# Patient Record
Sex: Female | Born: 1970 | Race: Black or African American | Hispanic: No | Marital: Single | Smoking: Never smoker
Health system: Southern US, Community
[De-identification: ages and names within clinical notes are randomized; demographics above are authoritative.]

## PROBLEM LIST (undated history)

## (undated) DIAGNOSIS — I10 Essential (primary) hypertension: Secondary | ICD-10-CM

## (undated) DIAGNOSIS — E119 Type 2 diabetes mellitus without complications: Secondary | ICD-10-CM

## (undated) HISTORY — PX: ABDOMINAL HYSTERECTOMY: SHX81

## (undated) HISTORY — DX: Type 2 diabetes mellitus without complications: E11.9

## (undated) HISTORY — PX: CHOLECYSTECTOMY: SHX55

## (undated) HISTORY — PX: OTHER SURGICAL HISTORY: SHX169

## (undated) HISTORY — DX: Essential (primary) hypertension: I10

---

## 2000-02-16 ENCOUNTER — Encounter: Admission: RE | Admit: 2000-02-16 | Discharge: 2000-02-16 | Payer: Self-pay | Admitting: Internal Medicine

## 2000-02-16 ENCOUNTER — Encounter: Payer: Self-pay | Admitting: Internal Medicine

## 2000-11-26 ENCOUNTER — Emergency Department (HOSPITAL_COMMUNITY): Admission: EM | Admit: 2000-11-26 | Discharge: 2000-11-27 | Payer: Self-pay | Admitting: Emergency Medicine

## 2000-11-28 ENCOUNTER — Emergency Department (HOSPITAL_COMMUNITY): Admission: EM | Admit: 2000-11-28 | Discharge: 2000-11-28 | Payer: Self-pay | Admitting: Emergency Medicine

## 2001-05-20 ENCOUNTER — Other Ambulatory Visit: Admission: RE | Admit: 2001-05-20 | Discharge: 2001-05-20 | Payer: Self-pay | Admitting: Obstetrics and Gynecology

## 2003-01-30 ENCOUNTER — Encounter: Payer: Self-pay | Admitting: Internal Medicine

## 2003-01-30 ENCOUNTER — Ambulatory Visit (HOSPITAL_COMMUNITY): Admission: RE | Admit: 2003-01-30 | Discharge: 2003-01-30 | Payer: Self-pay | Admitting: *Deleted

## 2004-04-26 ENCOUNTER — Encounter: Admission: RE | Admit: 2004-04-26 | Discharge: 2004-04-26 | Payer: Self-pay | Admitting: Internal Medicine

## 2004-06-28 ENCOUNTER — Ambulatory Visit (HOSPITAL_COMMUNITY): Admission: RE | Admit: 2004-06-28 | Discharge: 2004-06-28 | Payer: Self-pay | Admitting: Internal Medicine

## 2004-08-15 ENCOUNTER — Inpatient Hospital Stay (HOSPITAL_COMMUNITY): Admission: RE | Admit: 2004-08-15 | Discharge: 2004-08-18 | Payer: Self-pay | Admitting: Internal Medicine

## 2004-08-15 ENCOUNTER — Ambulatory Visit: Payer: Self-pay | Admitting: *Deleted

## 2005-05-10 ENCOUNTER — Encounter: Admission: RE | Admit: 2005-05-10 | Discharge: 2005-05-10 | Payer: Self-pay | Admitting: Internal Medicine

## 2006-06-13 ENCOUNTER — Ambulatory Visit (HOSPITAL_COMMUNITY): Admission: RE | Admit: 2006-06-13 | Discharge: 2006-06-13 | Payer: Self-pay | Admitting: Family Medicine

## 2007-06-04 ENCOUNTER — Inpatient Hospital Stay: Payer: Self-pay | Admitting: Internal Medicine

## 2007-06-27 ENCOUNTER — Ambulatory Visit: Payer: Self-pay | Admitting: Surgery

## 2007-06-28 ENCOUNTER — Ambulatory Visit: Payer: Self-pay | Admitting: Surgery

## 2007-10-16 ENCOUNTER — Ambulatory Visit: Payer: Self-pay | Admitting: Internal Medicine

## 2007-11-04 ENCOUNTER — Ambulatory Visit: Payer: Self-pay | Admitting: Internal Medicine

## 2008-04-15 ENCOUNTER — Ambulatory Visit: Payer: Self-pay | Admitting: Internal Medicine

## 2009-08-25 ENCOUNTER — Ambulatory Visit: Payer: Self-pay | Admitting: Internal Medicine

## 2009-12-27 ENCOUNTER — Ambulatory Visit (HOSPITAL_COMMUNITY): Admission: RE | Admit: 2009-12-27 | Discharge: 2009-12-27 | Payer: Self-pay | Admitting: Family Medicine

## 2010-09-20 ENCOUNTER — Ambulatory Visit: Payer: Self-pay | Admitting: Internal Medicine

## 2011-02-24 NOTE — Op Note (Signed)
NAME:  Jordan Mccoy, Jordan Mccoy               ACCOUNT NO.:  [id number]   MEDICAL RECORD NO.:  [id number]          PATIENT TYPE:  INP   LOCATION:  A341                          FACILITY:  [facility]   PHYSICIAN:  Bernerd Limbo. Destefano, M.D.DATE OF BIRTH:  August 11, 1971   DATE OF PROCEDURE:  08/15/2004  DATE OF DISCHARGE:  08/18/2004                                 OPERATIVE REPORT   This patient has had multiple enlarging fibroids for some time, and she was  admitted this morning for a total abdominal hysterectomy.  She is young  enough that she does not need to have her ovaries removed.   After the patient had adequately prepped and draped and a Foley catheter  inserted, a transverse Pfannenstiel incision was made.  This was carried  through the subcutaneous tissues to the surface of the anterior rectus  sheath, which was incised in a vertical manner extending from the pubis up  to the level of the umbilicus.  This was developed by elevating the fatty  subcutaneous tissue.  The muscles were retracted, the posterior rectus  sheath and peritoneum incised, the abdomen opened and explored.  The uterus  was noted to be markedly deformed by multiple fibroids.  It was at least six  months' size.  With upward traction on the body of the uterus, the left  round ligament was identified.  This was doubly clamped and then transected  and ligated with #1 chromic.  The mesosalpinx on the left was doubly  clamped, divided, and ligated distally with #1 chromic.  A similar procedure  was carried out on the right side.  It should be noted that the bladder was  pushed inferiorly out of harm's way.  Both ovaries appeared to be  essentially normal, and they were preserved.  With upward traction on the  uterus, utilizing the electrocautery, the cervix was coned out of the vagina  and the uterus was then removed from the peritoneal cavity.  The vaginal  cuff was closed with continuous locking #1 chromic, and the entire operative  area was then reperitonealized with continuous 0 chromic.  After determining  that all bleeding was under control, the wound was closed in layers, the  peritoneum and posterior rectus sheath with continuous 2-0 Novofil sutures,  the rectus muscle and anterior rectus sheath with continuous 2-0 Novofil  sutures, the subcutaneous tissues were closed with continuous 3-0 plain  catgut, and the skin was closed with a subcuticular suture of 3-0 plain  catgut.  At completion of the procedure, all bleeding was under control.  Telfa, 4 x 4's, and OpSite dressing were applied.  Blood loss was  approximately between 250 and 300 mL.  There was clear urine drainage.  The  patient tolerated the procedure nicely and left the room in good condition.     [name]   NMD/MEDQ  D:  08/22/2004  T:  08/23/2004  Job:  [id number]

## 2011-02-24 NOTE — Discharge Summary (Signed)
NAME:  Jordan Mccoy, Jordan Mccoy               ACCOUNT NO.:  [id number]   MEDICAL RECORD NO.:  [id number]          PATIENT TYPE:  INP   LOCATION:  A341                          FACILITY:  [facility]   PHYSICIAN:  Bernerd Limbo. Destefano, M.D.DATE OF BIRTH:  05-Apr-1971   DATE OF ADMISSION:  08/15/2004  DATE OF DISCHARGE:  11/10/2005LH                                 DISCHARGE SUMMARY   ADMITTING DIAGNOSIS:  Fibroid uterus.   DISCHARGE DIAGNOSIS:  Fibroid uterus.   OPERATION:  Total abdominal hysterectomy.   COMPLICATIONS:  None.   SURGEON:  Buena Irish, M.D.   PROGNOSIS:  Good, the patient recovered.   HISTORY:  This 40 year old black female was admitted to this hospital on  November 7 with long history of prolonged vaginal bleeding.  Repeated exams  showed a markedly increasing fibroid uterus, giving the patient abdominal  discomfort and bladder pressure. She was admitted for a total abdominal  hysterectomy.   PHYSICAL EXAMINATION:  Revealed a healthy, 41 year old black female in no  acute distress.  Heart and lungs were normal. There was a palpable mass  extending up out of the pelvis to above the umbilicus.   HOSPITAL COURSE AND TREATMENT:  On November 7 she was taken to the operating  room and a total abdominal hysterectomy for a huge fibroid uterus was  carried out.  Postoperatively she did well.  A Foley catheter was removed on  the November 8.  By November 9 she was up passing flatus.  She was started  on a soft diet.  IVs were decreased to 75 mL an hour.  On November 10 she  had a normal bowel movement, doing very well, tolerated diet without  difficulty.  After care was outlined and the patient was discharged home on  November 18 to be seen in my office in 1 week.     [name]   NMD/MEDQ  D:  10/07/2004  T:  10/07/2004  Job:  [id number]

## 2011-02-24 NOTE — H&P (Signed)
NAME:  Jordan Mccoy, Jordan Mccoy               ACCOUNT NO.:  [id number]   MEDICAL RECORD NO.:  [id number]          PATIENT TYPE:  AMB   LOCATION:  DAY                           FACILITY:  [facility]   PHYSICIAN:  Bernerd Limbo. Destefano, M.D.DATE OF BIRTH:  November 29, 1970   DATE OF ADMISSION:  DATE OF DISCHARGE:  LH                                HISTORY & PHYSICAL   This 40 year old black female is admitted to this hospital for an abdominal  hysterectomy.   HISTORY OF PRESENT ILLNESS:  Over a period of years, the patient has had a  gradually increasing mass in the lower abdomen.  The pelvic exam reveals  multiple fibroids that have gradually been increasing in size.  She has had  some increase in her vaginal bleeding at the time of period.  Her periods  usually last 7-10 days a month.  I have discussed her status with her.  We  have offered her the option to try other methods of removing the fibroids  rather than hysterectomy, but apparently this is what she wants done.  She  is a gravida 0, para 0, AB 0.  She is admitted now for hysterectomy.   PAST HISTORY:  She has had two or three ultrasounds revealing the multiple  fibroids that have shown gradual increase in size.  From 2000-2002, she had  several esophageal dilatations performed at [facility] for esophageal  stricture.  She has no complaints relative to this now.   ALLERGIES:  There is no history of any food or drug allergy.   MEDICATIONS:  At present, she is not on any medication.   PHYSICAL EXAMINATION:  GENERAL APPEARANCE:  A healthy, well-developed, well-  nourished, 40 year old, black female in no acute distress.  VITAL SIGNS:  Her blood pressure is 130/82, pulse 80 and respirations 20.  WEIGHT:  She weighs 185 pounds.  HEENT:  Normal.  No jaundice.  NECK:  Supple.  Thyroid not enlarged.  CARDIOVASCULAR:  Regular sinus rhythm.  No thrills or murmurs.  RESPIRATORY:  Chest clear to auscultation and percussion.  BREASTS:  Moderate sized.   Nipples symmetrical.  No abnormal masses.  Examination of both axillae is normal.  ABDOMEN:  Soft.  There is a large mass extending up out of the pelvis to the  level of the umbilicus.  There is some tenderness to touch.  No other  visceromegaly is noted.  Normal peristalsis.  LIMBS:  Negative.  BACK:  Negative.  PELVIC:  There is a marital introitus.  No pathology of Bartholin's or  Skene's glands.  The cervix is in mid position.  There is a small cervical  polyp protruding from the lower lip at about 5 o'clock on the dial which was  cauterized with silver nitrate at her last examination on June 24, 2004.  Bimanual exam reveals a hugh fibroid uterus that extends up to the  level of the umbilicus.  Rectovaginal exam is confirmatory of the cervix and  some posterior fibroids.   ADMISSION DIAGNOSIS:  Fibroid uterus.   DISPOSITION:  The patient is admitted for total abdominal hysterectomy.  The  surgery, risks and complications have been discussed with the patient at  length.  She agrees to the surgery.  She has been scheduled for August 15, 2004.  The diagnosis is fibroid uterus.     [name]   NMD/MEDQ  D:  08/11/2004  T:  08/11/2004  Job:  [id number]

## 2011-02-24 NOTE — Procedures (Signed)
NAME:  Jordan Mccoy, Jordan Mccoy               ACCOUNT NO.:  [id number]   MEDICAL RECORD NO.:  [id number]          PATIENT TYPE:  INP   LOCATION:  A341                          FACILITY:  [facility]   PHYSICIAN:  Vida Roller, M.D.   DATE OF BIRTH:  Oct 08, 1971   DATE OF PROCEDURE:  08/12/2004  DATE OF DISCHARGE:                                EKG INTERPRETATION   RESULTS:  This is sinus rhythm at a ventricular rate of 72 with normal  intervals and normal axes.  There are no ST-T wave changes concerning for  ischemia.  There is some early repolarization seen.  There is no Q-waves  concerning for old myocardial infarction.     Trey Paula   JH/MEDQ  D:  08/16/2004  T:  08/16/2004  Job:  [id number]

## 2011-07-12 DIAGNOSIS — J45909 Unspecified asthma, uncomplicated: Secondary | ICD-10-CM | POA: Insufficient documentation

## 2011-07-12 DIAGNOSIS — K222 Esophageal obstruction: Secondary | ICD-10-CM | POA: Insufficient documentation

## 2011-07-12 DIAGNOSIS — I10 Essential (primary) hypertension: Secondary | ICD-10-CM | POA: Insufficient documentation

## 2011-07-12 DIAGNOSIS — Q392 Congenital tracheo-esophageal fistula without atresia: Secondary | ICD-10-CM | POA: Insufficient documentation

## 2011-09-27 DIAGNOSIS — Q393 Congenital stenosis and stricture of esophagus: Secondary | ICD-10-CM | POA: Insufficient documentation

## 2011-11-14 ENCOUNTER — Ambulatory Visit: Payer: Self-pay

## 2011-11-29 ENCOUNTER — Ambulatory Visit: Payer: Self-pay | Admitting: Podiatry

## 2011-12-06 ENCOUNTER — Ambulatory Visit: Payer: Self-pay | Admitting: Anesthesiology

## 2011-12-07 ENCOUNTER — Ambulatory Visit: Payer: Self-pay | Admitting: Podiatry

## 2013-02-12 ENCOUNTER — Ambulatory Visit: Payer: Self-pay | Admitting: Internal Medicine

## 2014-04-09 ENCOUNTER — Ambulatory Visit: Payer: Self-pay | Admitting: Physician Assistant

## 2014-05-19 ENCOUNTER — Ambulatory Visit: Payer: Self-pay | Admitting: Physician Assistant

## 2015-05-26 ENCOUNTER — Other Ambulatory Visit: Payer: Self-pay | Admitting: Physician Assistant

## 2015-05-26 DIAGNOSIS — Z1231 Encounter for screening mammogram for malignant neoplasm of breast: Secondary | ICD-10-CM

## 2015-06-02 ENCOUNTER — Ambulatory Visit
Admission: RE | Admit: 2015-06-02 | Discharge: 2015-06-02 | Disposition: A | Discharge status: Home / Self Care | Payer: BLUE CROSS/BLUE SHIELD | Source: Ambulatory Visit | Attending: Physician Assistant | Admitting: Physician Assistant

## 2015-06-02 ENCOUNTER — Ambulatory Visit: Payer: Self-pay

## 2015-06-02 DIAGNOSIS — Z1231 Encounter for screening mammogram for malignant neoplasm of breast: Secondary | ICD-10-CM | POA: Insufficient documentation

## 2016-02-10 DIAGNOSIS — H5213 Myopia, bilateral: Secondary | ICD-10-CM | POA: Diagnosis not present

## 2016-02-28 DIAGNOSIS — E669 Obesity, unspecified: Secondary | ICD-10-CM | POA: Diagnosis not present

## 2016-05-22 DIAGNOSIS — S93602A Unspecified sprain of left foot, initial encounter: Secondary | ICD-10-CM | POA: Diagnosis not present

## 2016-05-22 DIAGNOSIS — M25572 Pain in left ankle and joints of left foot: Secondary | ICD-10-CM | POA: Diagnosis not present

## 2016-06-07 ENCOUNTER — Other Ambulatory Visit: Payer: Self-pay | Admitting: Physician Assistant

## 2016-06-07 DIAGNOSIS — Z1231 Encounter for screening mammogram for malignant neoplasm of breast: Secondary | ICD-10-CM

## 2016-06-13 DIAGNOSIS — M25572 Pain in left ankle and joints of left foot: Secondary | ICD-10-CM | POA: Diagnosis not present

## 2016-06-13 DIAGNOSIS — S93492D Sprain of other ligament of left ankle, subsequent encounter: Secondary | ICD-10-CM | POA: Diagnosis not present

## 2016-07-05 ENCOUNTER — Other Ambulatory Visit: Payer: Self-pay | Admitting: Physician Assistant

## 2016-07-05 ENCOUNTER — Ambulatory Visit
Admission: RE | Admit: 2016-07-05 | Discharge: 2016-07-05 | Disposition: A | Discharge status: Home / Self Care | Payer: BLUE CROSS/BLUE SHIELD | Source: Ambulatory Visit | Attending: Physician Assistant | Admitting: Physician Assistant

## 2016-07-05 DIAGNOSIS — Z1231 Encounter for screening mammogram for malignant neoplasm of breast: Secondary | ICD-10-CM

## 2016-07-05 DIAGNOSIS — R928 Other abnormal and inconclusive findings on diagnostic imaging of breast: Secondary | ICD-10-CM | POA: Diagnosis not present

## 2016-07-10 ENCOUNTER — Other Ambulatory Visit: Payer: Self-pay | Admitting: Physician Assistant

## 2016-07-10 DIAGNOSIS — N6310 Unspecified lump in the right breast, unspecified quadrant: Secondary | ICD-10-CM

## 2016-07-27 ENCOUNTER — Ambulatory Visit
Admission: RE | Admit: 2016-07-27 | Discharge: 2016-07-27 | Disposition: A | Discharge status: Home / Self Care | Payer: BLUE CROSS/BLUE SHIELD | Source: Ambulatory Visit | Attending: Physician Assistant | Admitting: Physician Assistant

## 2016-07-27 DIAGNOSIS — N6310 Unspecified lump in the right breast, unspecified quadrant: Secondary | ICD-10-CM

## 2016-07-27 DIAGNOSIS — N6489 Other specified disorders of breast: Secondary | ICD-10-CM | POA: Diagnosis not present

## 2016-07-27 DIAGNOSIS — R928 Other abnormal and inconclusive findings on diagnostic imaging of breast: Secondary | ICD-10-CM | POA: Diagnosis not present

## 2016-08-03 DIAGNOSIS — I10 Essential (primary) hypertension: Secondary | ICD-10-CM | POA: Diagnosis not present

## 2016-08-03 DIAGNOSIS — E6609 Other obesity due to excess calories: Secondary | ICD-10-CM | POA: Diagnosis not present

## 2016-08-03 DIAGNOSIS — J45998 Other asthma: Secondary | ICD-10-CM | POA: Diagnosis not present

## 2016-08-03 DIAGNOSIS — Z6833 Body mass index (BMI) 33.0-33.9, adult: Secondary | ICD-10-CM | POA: Diagnosis not present

## 2016-08-03 DIAGNOSIS — R7301 Impaired fasting glucose: Secondary | ICD-10-CM | POA: Diagnosis not present

## 2016-08-03 DIAGNOSIS — Z23 Encounter for immunization: Secondary | ICD-10-CM | POA: Diagnosis not present

## 2016-08-18 DIAGNOSIS — I10 Essential (primary) hypertension: Secondary | ICD-10-CM | POA: Diagnosis not present

## 2016-08-18 DIAGNOSIS — Z0001 Encounter for general adult medical examination with abnormal findings: Secondary | ICD-10-CM | POA: Diagnosis not present

## 2016-08-18 DIAGNOSIS — E782 Mixed hyperlipidemia: Secondary | ICD-10-CM | POA: Diagnosis not present

## 2016-08-18 DIAGNOSIS — R635 Abnormal weight gain: Secondary | ICD-10-CM | POA: Diagnosis not present

## 2016-09-08 DIAGNOSIS — Z6832 Body mass index (BMI) 32.0-32.9, adult: Secondary | ICD-10-CM | POA: Diagnosis not present

## 2016-09-08 DIAGNOSIS — E782 Mixed hyperlipidemia: Secondary | ICD-10-CM | POA: Diagnosis not present

## 2016-09-08 DIAGNOSIS — I10 Essential (primary) hypertension: Secondary | ICD-10-CM | POA: Diagnosis not present

## 2016-11-13 DIAGNOSIS — R7301 Impaired fasting glucose: Secondary | ICD-10-CM | POA: Diagnosis not present

## 2016-11-13 DIAGNOSIS — Z6831 Body mass index (BMI) 31.0-31.9, adult: Secondary | ICD-10-CM | POA: Diagnosis not present

## 2016-11-13 DIAGNOSIS — I10 Essential (primary) hypertension: Secondary | ICD-10-CM | POA: Diagnosis not present

## 2016-11-13 DIAGNOSIS — L209 Atopic dermatitis, unspecified: Secondary | ICD-10-CM | POA: Diagnosis not present

## 2016-12-12 ENCOUNTER — Other Ambulatory Visit: Payer: Self-pay | Admitting: Nurse Practitioner

## 2016-12-12 DIAGNOSIS — J45998 Other asthma: Secondary | ICD-10-CM | POA: Diagnosis not present

## 2016-12-12 DIAGNOSIS — I10 Essential (primary) hypertension: Secondary | ICD-10-CM | POA: Diagnosis not present

## 2016-12-12 DIAGNOSIS — N63 Unspecified lump in unspecified breast: Secondary | ICD-10-CM

## 2016-12-12 DIAGNOSIS — E6609 Other obesity due to excess calories: Secondary | ICD-10-CM | POA: Diagnosis not present

## 2017-01-31 ENCOUNTER — Other Ambulatory Visit: Payer: BLUE CROSS/BLUE SHIELD

## 2017-01-31 ENCOUNTER — Ambulatory Visit: Payer: BLUE CROSS/BLUE SHIELD

## 2017-02-15 ENCOUNTER — Ambulatory Visit
Admission: RE | Admit: 2017-02-15 | Discharge: 2017-02-15 | Disposition: A | Discharge status: Home / Self Care | Payer: BLUE CROSS/BLUE SHIELD | Source: Ambulatory Visit | Attending: Nurse Practitioner | Admitting: Nurse Practitioner

## 2017-02-15 DIAGNOSIS — N6310 Unspecified lump in the right breast, unspecified quadrant: Secondary | ICD-10-CM | POA: Diagnosis not present

## 2017-02-15 DIAGNOSIS — N63 Unspecified lump in unspecified breast: Secondary | ICD-10-CM

## 2017-03-21 DIAGNOSIS — H5213 Myopia, bilateral: Secondary | ICD-10-CM | POA: Diagnosis not present

## 2017-07-31 ENCOUNTER — Other Ambulatory Visit: Payer: Self-pay | Admitting: Nurse Practitioner

## 2017-07-31 DIAGNOSIS — N63 Unspecified lump in unspecified breast: Secondary | ICD-10-CM

## 2017-09-05 ENCOUNTER — Ambulatory Visit
Admission: RE | Admit: 2017-09-05 | Discharge: 2017-09-05 | Disposition: A | Discharge status: Home / Self Care | Payer: BLUE CROSS/BLUE SHIELD | Source: Ambulatory Visit | Attending: Nurse Practitioner | Admitting: Nurse Practitioner

## 2017-09-05 DIAGNOSIS — N6310 Unspecified lump in the right breast, unspecified quadrant: Secondary | ICD-10-CM | POA: Diagnosis not present

## 2017-09-05 DIAGNOSIS — R928 Other abnormal and inconclusive findings on diagnostic imaging of breast: Secondary | ICD-10-CM | POA: Diagnosis not present

## 2017-09-05 DIAGNOSIS — N63 Unspecified lump in unspecified breast: Secondary | ICD-10-CM

## 2017-10-03 ENCOUNTER — Other Ambulatory Visit: Payer: Self-pay | Admitting: Nurse Practitioner

## 2017-10-03 DIAGNOSIS — R7301 Impaired fasting glucose: Secondary | ICD-10-CM | POA: Diagnosis not present

## 2017-10-03 DIAGNOSIS — R638 Other symptoms and signs concerning food and fluid intake: Secondary | ICD-10-CM | POA: Diagnosis not present

## 2017-10-03 DIAGNOSIS — I10 Essential (primary) hypertension: Secondary | ICD-10-CM | POA: Diagnosis not present

## 2017-10-03 DIAGNOSIS — Z0001 Encounter for general adult medical examination with abnormal findings: Secondary | ICD-10-CM | POA: Diagnosis not present

## 2017-10-03 DIAGNOSIS — R635 Abnormal weight gain: Secondary | ICD-10-CM | POA: Diagnosis not present

## 2017-10-03 DIAGNOSIS — E559 Vitamin D deficiency, unspecified: Secondary | ICD-10-CM | POA: Diagnosis not present

## 2017-10-26 LAB — VITAMIN D 25 HYDROXY (VIT D DEFICIENCY, FRACTURES): VIT D 25 HYDROXY: 9.2 ng/mL — AB (ref 30.0–100.0)

## 2017-10-26 LAB — COMPREHENSIVE METABOLIC PANEL
ALBUMIN: 4.6 g/dL (ref 3.5–5.5)
ALK PHOS: 74 IU/L (ref 39–117)
ALT: 20 IU/L (ref 0–32)
AST: 19 IU/L (ref 0–40)
Albumin/Globulin Ratio: 1.6 (ref 1.2–2.2)
BUN / CREAT RATIO: 12 (ref 9–23)
BUN: 11 mg/dL (ref 6–24)
CHLORIDE: 106 mmol/L (ref 96–106)
CO2: 18 mmol/L — ABNORMAL LOW (ref 20–29)
Calcium: 9.7 mg/dL (ref 8.7–10.2)
Creatinine, Ser: 0.95 mg/dL (ref 0.57–1.00)
GFR calc non Af Amer: 71 mL/min/{1.73_m2} (ref >59)
GFR, EST AFRICAN AMERICAN: 82 mL/min/{1.73_m2} (ref >59)
GLOBULIN, TOTAL: 2.8 g/dL (ref 1.5–4.5)
GLUCOSE: 118 mg/dL — AB (ref 65–99)
Potassium: 4.8 mmol/L (ref 3.5–5.2)
SODIUM: 143 mmol/L (ref 134–144)
TOTAL PROTEIN: 7.4 g/dL (ref 6.0–8.5)

## 2017-10-26 LAB — CBC
HEMATOCRIT: 40.5 % (ref 34.0–46.6)
Hemoglobin: 13.5 g/dL (ref 11.1–15.9)
MCH: 26.8 pg (ref 26.6–33.0)
MCHC: 33.3 g/dL (ref 31.5–35.7)
MCV: 81 fL (ref 79–97)
PLATELETS: 219 10*3/uL (ref 150–379)
RBC: 5.03 x10E6/uL (ref 3.77–5.28)
RDW: 14.1 % (ref 12.3–15.4)
WBC: 9.3 10*3/uL (ref 3.4–10.8)

## 2017-10-26 LAB — T4, FREE: FREE T4: 1.48 ng/dL (ref 0.82–1.77)

## 2017-10-26 LAB — LIPID PANEL W/O CHOL/HDL RATIO
Cholesterol, Total: 220 mg/dL — ABNORMAL HIGH (ref 100–199)
HDL: 41 mg/dL (ref >39)
LDL Calculated: 115 mg/dL — ABNORMAL HIGH (ref 0–99)
TRIGLYCERIDES: 318 mg/dL — AB (ref 0–149)
VLDL Cholesterol Cal: 64 mg/dL — ABNORMAL HIGH (ref 5–40)

## 2017-10-26 LAB — HGB A1C W/O EAG: HEMOGLOBIN A1C: 7.1 % — AB (ref 4.8–5.6)

## 2017-10-26 LAB — TSH: TSH: 1.18 u[IU]/mL (ref 0.450–4.500)

## 2017-11-08 ENCOUNTER — Encounter: Payer: Self-pay | Admitting: Nurse Practitioner

## 2017-11-08 ENCOUNTER — Ambulatory Visit: Payer: BLUE CROSS/BLUE SHIELD | Admitting: Nurse Practitioner

## 2017-11-08 VITALS — BP 150/78 | HR 69 | Resp 16 | Ht 69.0 in | Wt 226.0 lb

## 2017-11-08 DIAGNOSIS — R062 Wheezing: Secondary | ICD-10-CM

## 2017-11-08 DIAGNOSIS — E559 Vitamin D deficiency, unspecified: Secondary | ICD-10-CM

## 2017-11-08 DIAGNOSIS — E668 Other obesity: Secondary | ICD-10-CM

## 2017-11-08 MED ORDER — PHENTERMINE HCL 37.5 MG PO TABS: 37.5000 mg | ORAL_TABLET | Freq: Every day | ORAL | 1 refills | Status: DC

## 2017-11-08 MED ORDER — ALBUTEROL SULFATE HFA 108 (90 BASE) MCG/ACT IN AERS: 2.0000 | INHALATION_SPRAY | Freq: Four times a day (QID) | RESPIRATORY_TRACT | 5 refills | Status: DC | PRN

## 2017-11-08 MED ORDER — ERGOCALCIFEROL 1.25 MG (50000 UT) PO CAPS: 50000.0000 [IU] | ORAL_CAPSULE | ORAL | 5 refills | Status: DC

## 2017-11-08 NOTE — Patient Instructions (Signed)

## 2017-11-08 NOTE — Progress Notes (Addendum)
[facility] [address] [place], [place] [address]  Internal MEDICINE  Office Visit Note  Patient Name: Jordan Mccoy  [id number]  [id number]  Date of Service: 11/18/2017  Chief Complaint  Patient presents with  . Hypertension    Hypertension  This is a chronic problem. The current episode started more than 1 year ago. The problem is unchanged. The problem is controlled. Pertinent negatives include no headaches, neck pain, palpitations or shortness of breath. There are no associated agents to hypertension. Risk factors for coronary artery disease include obesity and post-menopausal state. The current treatment provides mild improvement. There are no compliance problems.     Pt is here for routine follow up.    Current Medication: Outpatient Encounter Medications as of 11/08/2017  Medication Sig  . albuterol (VENTOLIN HFA) 108 (90 Base) MCG/ACT inhaler Inhale 2 puffs into the lungs every 6 (six) hours as needed for wheezing or shortness of breath.  . bisoprolol-hydrochlorothiazide (ZIAC) 10-6.25 MG tablet Take 1 tablet by mouth daily.  . Fluticasone-Salmeterol (ADVAIR DISKUS) 250-50 MCG/DOSE AEPB Inhale into the lungs.  . [DISCONTINUED] albuterol (VENTOLIN HFA) 108 (90 Base) MCG/ACT inhaler Inhale into the lungs.  . ergocalciferol (DRISDOL) 50000 units capsule Take 1 capsule (50,000 Units total) by mouth once a week.  . phentermine (ADIPEX-P) 37.5 MG tablet Take 1 tablet (37.5 mg total) by mouth daily before breakfast.   No facility-administered encounter medications on file as of 11/08/2017.     Surgical History: Past Surgical History:  Procedure Laterality Date  . ABDOMINAL HYSTERECTOMY    . CHOLECYSTECTOMY    . right foot surgery      Medical History: Past Medical History:  Diagnosis Date  . Hypertension     Family History: Family History  Problem Relation Age of Onset  . Breast cancer Mother 38  . Breast cancer Maternal Aunt 35    Social History    Socioeconomic History  . Marital status: Single    Spouse name: Not on file  . Number of children: Not on file  . Years of education: Not on file  . Highest education level: Not on file  Social Needs  . Financial resource strain: Not on file  . Food insecurity - worry: Not on file  . Food insecurity - inability: Not on file  . Transportation needs - medical: Not on file  . Transportation needs - non-medical: Not on file  Occupational History  . Not on file  Tobacco Use  . Smoking status: Never Smoker  . Smokeless tobacco: Never Used  Substance and Sexual Activity  . Alcohol use: Yes    Frequency: Never    Comment: social  . Drug use: No  . Sexual activity: Not on file  Other Topics Concern  . Not on file  Social History Narrative  . Not on file      Review of Systems  Constitutional: Negative for activity change, chills, fatigue and unexpected weight change.       Weight loss of 2 pounds since her most recent visit.   HENT: Negative for congestion, postnasal drip, rhinorrhea, sneezing and sore throat.   Eyes: Negative.  Negative for redness.  Respiratory: Negative for cough, chest tightness, shortness of breath and wheezing.        Well controlled asthma  Cardiovascular: Negative for palpitations.  Gastrointestinal: Negative for abdominal pain, constipation, diarrhea, nausea and vomiting.  Endocrine: Negative for cold intolerance, heat intolerance, polydipsia, polyphagia and polyuria.  Genitourinary: Negative for dysuria  and frequency.  Musculoskeletal: Negative for arthralgias, back pain, joint swelling and neck pain.  Skin: Negative for rash.  Allergic/Immunologic: Negative for environmental allergies, food allergies and immunocompromised state.  Neurological: Negative for tremors, numbness and headaches.  Hematological: Negative for adenopathy. Does not bruise/bleed easily.  Psychiatric/Behavioral: Negative for behavioral problems (Depression), sleep disturbance  and suicidal ideas. The patient is not nervous/anxious.     Today's Vitals   11/08/17 1642  BP: (!) 150/78  Pulse: 69  Resp: 16  SpO2: 100%  Weight: 226 lb (102.5 kg)  Height: 5\' 9"  (1.753 m)    Physical Exam  Constitutional: She is oriented to person, place, and time. She appears well-developed and well-nourished. No distress.  HENT:  Head: Normocephalic and atraumatic.  Mouth/Throat: Oropharynx is clear and moist. No oropharyngeal exudate.  Eyes: EOM are normal. Pupils are equal, round, and reactive to light.  Neck: Normal range of motion. Neck supple. No JVD present. Carotid bruit is not present. No tracheal deviation present. No thyromegaly present.  Cardiovascular: Normal rate, regular rhythm and normal heart sounds. Exam reveals no gallop and no friction rub.  No murmur heard. Pulmonary/Chest: Effort normal. No respiratory distress. She has no wheezes. She has no rales. She exhibits no tenderness.  Abdominal: Soft. Bowel sounds are normal. There is no tenderness.  Musculoskeletal: Normal range of motion.  Lymphadenopathy:    She has no cervical adenopathy.  Neurological: She is alert and oriented to person, place, and time. No cranial nerve deficit.  Skin: Skin is warm and dry. She is not diaphoretic.  Psychiatric: She has a normal mood and affect. Her behavior is normal. Judgment and thought content normal.  Nursing note and vitals reviewed.  Assessment/Plan: 1. Vitamin D deficiency Recent labs reviewed.  - ergocalciferol (DRISDOL) 50000 units capsule; Take 1 capsule (50,000 Units total) by mouth once a week.  Dispense: 4 capsule; Refill: 5  2. Wheezing Doing well. Renew rx for rescue inhaler. uas as needed and as prescribed.  - albuterol (VENTOLIN HFA) 108 (90 Base) MCG/ACT inhaler; Inhale 2 puffs into the lungs every 6 (six) hours as needed for wheezing or shortness of breath.  Dispense: 1 Inhaler; Refill: 5  3. Moderate obesity - phentermine (ADIPEX-P) 37.5 MG  tablet; Take 1 tablet (37.5 mg total) by mouth daily before breakfast.  Dispense: 30 tablet; Refill: 1 Recommend 100-150 calorie diet. Increase routine physical exercise.   General Counseling: Jordan Mccoy understanding of the findings of todays visit and agrees with plan of treatment. I have discussed any further diagnostic evaluation that may be needed or ordered today. We also reviewed her medications today. she has been encouraged to call the office with any questions or concerns that should arise related to todays visit.    There is a liability release in patients' chart. There has been a 10 minute discussion about the side effects including but not limited to elevated blood pressure, anxiety, lack of sleep and dry mouth. Pt understands and will like to start/continue on appetite suppressant at this time. There will be one month RX given at the time of visit with proper follow up. Nova diet plan with restricted calories is given to the pt. Pt understands and agrees with  plan of treatment  This patient was seen by Vincent Gros, FNP- C in Collaboration with Dr Lyndon Code as a part of collaborative care agreement    Meds ordered this encounter  Medications  . albuterol (VENTOLIN HFA) 108 (90 Base) MCG/ACT inhaler  Sig: Inhale 2 puffs into the lungs every 6 (six) hours as needed for wheezing or shortness of breath.    Dispense:  1 Inhaler    Refill:  5    Order Specific Question:   Supervising Provider    Answer:   Lyndon CodeKHAN, FOZIA M [[id number]]  . ergocalciferol (DRISDOL) 50000 units capsule    Sig: Take 1 capsule (50,000 Units total) by mouth once a week.    Dispense:  4 capsule    Refill:  5    Order Specific Question:   Supervising Provider    Answer:   Lyndon CodeKHAN, FOZIA M [[id number]]  . phentermine (ADIPEX-P) 37.5 MG tablet    Sig: Take 1 tablet (37.5 mg total) by mouth daily before breakfast.    Dispense:  30 tablet    Refill:  1    Order Specific Question:   Supervising Provider     Answer:   Lyndon CodeKHAN, FOZIA M [[id number]]    Time spent: 9115  Minutes     Dr Lyndon CodeFozia M Khan Internal medicine

## 2017-12-14 ENCOUNTER — Other Ambulatory Visit: Payer: Self-pay

## 2017-12-19 ENCOUNTER — Telehealth: Payer: Self-pay

## 2017-12-19 NOTE — Telephone Encounter (Signed)
Pt left vm asking for a refill of phentermine, I called pt back and left vm that she will have to f-up about medication refills when she comes in for her next OV.  Advised to call back if she had any questions.  dbs

## 2018-01-07 ENCOUNTER — Ambulatory Visit: Payer: Self-pay | Admitting: Nurse Practitioner

## 2018-02-07 ENCOUNTER — Encounter: Payer: Self-pay | Admitting: Nurse Practitioner

## 2018-02-07 ENCOUNTER — Ambulatory Visit: Payer: BLUE CROSS/BLUE SHIELD | Admitting: Nurse Practitioner

## 2018-02-07 VITALS — BP 150/68 | HR 75 | Resp 16 | Ht 69.0 in | Wt 223.0 lb

## 2018-02-07 DIAGNOSIS — E668 Other obesity: Secondary | ICD-10-CM

## 2018-02-07 DIAGNOSIS — I10 Essential (primary) hypertension: Secondary | ICD-10-CM | POA: Diagnosis not present

## 2018-02-07 DIAGNOSIS — E1165 Type 2 diabetes mellitus with hyperglycemia: Secondary | ICD-10-CM

## 2018-02-07 LAB — POCT GLYCOSYLATED HEMOGLOBIN (HGB A1C): HEMOGLOBIN A1C: 6.9

## 2018-02-07 MED ORDER — PHENTERMINE HCL 37.5 MG PO TABS
37.5000 mg | ORAL_TABLET | Freq: Every day | ORAL | 1 refills | Status: DC
Start: 2018-02-07 — End: 2019-01-20

## 2018-02-07 MED ORDER — HYDROCHLOROTHIAZIDE 12.5 MG PO TABS: 12.5000 mg | ORAL_TABLET | Freq: Every day | ORAL | 3 refills | Status: DC

## 2018-02-07 NOTE — Progress Notes (Signed)
[facility] [address] [place], [place] [address]  Internal MEDICINE  Office Visit Note  Patient Name: Jordan Mccoy  [id number]  [id number]  Date of Service: 03/04/2018  Chief Complaint  Patient presents with  . Hypertension  . Diabetes    Hypertension  This is a chronic problem. The current episode started more than 1 year ago. The problem is unchanged. The problem is resistant. Associated symptoms include peripheral edema. Pertinent negatives include no headaches, neck pain, palpitations or shortness of breath. Risk factors for coronary artery disease include family history, diabetes mellitus and obesity. Past treatments include beta blockers and diuretics. The current treatment provides moderate improvement. Compliance problems include exercise.   Diabetes  She presents for her follow-up diabetic visit. She has type 2 diabetes mellitus. No MedicAlert identification noted. Her disease course has been stable. There are no hypoglycemic associated symptoms. Pertinent negatives for hypoglycemia include no headaches, nervousness/anxiousness or tremors. There are no diabetic associated symptoms. Pertinent negatives for diabetes include no fatigue, no polydipsia, no polyphagia and no polyuria. Symptoms are stable. There are no diabetic complications. Risk factors for coronary artery disease include diabetes mellitus, dyslipidemia and hypertension. Current diabetic treatment includes diet. She is compliant with treatment all of the time. She is following a generally healthy diet. Meal planning includes avoidance of concentrated sweets. She has not had a previous visit with a dietitian. She participates in exercise intermittently. An ACE inhibitor/angiotensin II receptor blocker is not being taken. She does not see a podiatrist.Eye exam is not current.    Pt is here for routine follow up.    Current Medication: Outpatient Encounter Medications as of 02/07/2018  Medication Sig  .  albuterol (VENTOLIN HFA) 108 (90 Base) MCG/ACT inhaler Inhale 2 puffs into the lungs every 6 (six) hours as needed for wheezing or shortness of breath.  . bisoprolol-hydrochlorothiazide (ZIAC) 10-6.25 MG tablet Take 1 tablet by mouth daily.  . ergocalciferol (DRISDOL) 50000 units capsule Take 1 capsule (50,000 Units total) by mouth once a week.  . Fluticasone-Salmeterol (ADVAIR DISKUS) 250-50 MCG/DOSE AEPB Inhale into the lungs.  . hydrochlorothiazide (HYDRODIURIL) 12.5 MG tablet Take 1 tablet (12.5 mg total) by mouth daily.  . phentermine (ADIPEX-P) 37.5 MG tablet Take 1 tablet (37.5 mg total) by mouth daily before breakfast.  . [DISCONTINUED] phentermine (ADIPEX-P) 37.5 MG tablet Take 1 tablet (37.5 mg total) by mouth daily before breakfast.   No facility-administered encounter medications on file as of 02/07/2018.     Surgical History: Past Surgical History:  Procedure Laterality Date  . ABDOMINAL HYSTERECTOMY    . CHOLECYSTECTOMY    . right foot surgery      Medical History: Past Medical History:  Diagnosis Date  . Hypertension     Family History: Family History  Problem Relation Age of Onset  . Breast cancer Mother 76  . Breast cancer Maternal Aunt 44    Social History   Socioeconomic History  . Marital status: Single    Spouse name: Not on file  . Number of children: Not on file  . Years of education: Not on file  . Highest education level: Not on file  Occupational History  . Not on file  Social Needs  . Financial resource strain: Not on file  . Food insecurity:    Worry: Not on file    Inability: Not on file  . Transportation needs:    Medical: Not on file    Non-medical: Not on file  Tobacco Use  .  Smoking status: Never Smoker  . Smokeless tobacco: Never Used  Substance and Sexual Activity  . Alcohol use: Yes    Frequency: Never    Comment: social  . Drug use: No  . Sexual activity: Not on file  Lifestyle  . Physical activity:    Days per week: Not  on file    Minutes per session: Not on file  . Stress: Not on file  Relationships  . Social connections:    Talks on phone: Not on file    Gets together: Not on file    Attends religious service: Not on file    Active member of club or organization: Not on file    Attends meetings of clubs or organizations: Not on file    Relationship status: Not on file  . Intimate partner violence:    Fear of current or ex partner: Not on file    Emotionally abused: Not on file    Physically abused: Not on file    Forced sexual activity: Not on file  Other Topics Concern  . Not on file  Social History Narrative  . Not on file      Review of Systems  Constitutional: Negative for activity change, chills, fatigue and unexpected weight change.       Weight loss of 3 pounds since most recent visit  HENT: Positive for postnasal drip and sinus pressure. Negative for congestion, rhinorrhea, sneezing and sore throat.   Eyes: Negative.  Negative for redness.  Respiratory: Negative for cough, chest tightness, shortness of breath and wheezing.        Well controlled asthma  Cardiovascular: Negative for palpitations.       Elevated blood pressure.  Gastrointestinal: Negative for abdominal pain, constipation, diarrhea, nausea and vomiting.  Endocrine: Negative for cold intolerance, heat intolerance, polydipsia, polyphagia and polyuria.  Genitourinary: Negative for dysuria and frequency.  Musculoskeletal: Negative for arthralgias, back pain, joint swelling and neck pain.  Skin: Negative for rash.  Allergic/Immunologic: Positive for environmental allergies. Negative for food allergies and immunocompromised state.  Neurological: Negative for tremors, numbness and headaches.  Hematological: Negative for adenopathy. Does not bruise/bleed easily.  Psychiatric/Behavioral: Negative for behavioral problems (Depression), sleep disturbance and suicidal ideas. The patient is not nervous/anxious.     Today's  Vitals   02/07/18 1519  BP: (!) 150/68  Pulse: 75  Resp: 16  SpO2: 97%  Weight: 223 lb (101.2 kg)  Height:  (1.753 m)    Physical Exam  Constitutional: She is oriented to person, place, and time. She appears well-developed and well-nourished. No distress.  HENT:  Head: Normocephalic and atraumatic.  Mouth/Throat: Oropharynx is clear and moist. No oropharyngeal exudate.  Eyes: Pupils are equal, round, and reactive to light. EOM are normal.  Neck: Normal range of motion. Neck supple. No JVD present. Carotid bruit is not present. No tracheal deviation present. No thyromegaly present.  Cardiovascular: Normal rate, regular rhythm and normal heart sounds. Exam reveals no gallop and no friction rub.  No murmur heard. Pulmonary/Chest: Effort normal. No respiratory distress. She has no wheezes. She has no rales. She exhibits no tenderness.  Abdominal: Soft. Bowel sounds are normal. There is no tenderness.  Musculoskeletal: Normal range of motion.  Lymphadenopathy:    She has no cervical adenopathy.  Neurological: She is alert and oriented to person, place, and time. No cranial nerve deficit.  Skin: Skin is warm and dry. She is not diaphoretic.  Psychiatric: She has a normal mood and  affect. Her behavior is normal. Judgment and thought content normal.  Nursing note and vitals reviewed.   Assessment/Plan: 1. Uncontrolled type 2 diabetes mellitus with hyperglycemia (HCC) - POCT HgB A1C 6.9 today. Continue diabetic medication as prescribed.   2. Essential hypertension Add low dose HCTZ 12.5mg  tablets daily. Continue regular bp medication as prescribed. Limit intake of salt and increase intake of water every day.  - hydrochlorothiazide (HYDRODIURIL) 12.5 MG tablet; Take 1 tablet (12.5 mg total) by mouth daily.  Dispense: 30 tablet; Refill: 3  3. Moderate obesity.dfk Continue phentermine 37.5mg  tablets daily for additional 30 days. Limit calorie intake to 1200-1500 calories per day.  Encouraged her to participate in routine physical exercise.  - phentermine (ADIPEX-P) 37.5 MG tablet; Take 1 tablet (37.5 mg total) by mouth daily before breakfast.  Dispense: 30 tablet; Refill: 1  General Counseling: Jessy verbalizes understanding of the findings of todays visit and agrees with plan of treatment. I have discussed any further diagnostic evaluation that may be needed or ordered today. We also reviewed her medications today. she has been encouraged to call the office with any questions or concerns that should arise related to todays visit.   There is a liability release in patients' chart. There has been a 10 minute discussion about the side effects including but not limited to elevated blood pressure, anxiety, lack of sleep and dry mouth. Pt understands and will like to start/continue on appetite suppressant at this time. There will be one month RX given at the time of visit with proper follow up. Nova diet plan with restricted calories is given to the pt. Pt understands and agrees with  plan of treatment   This patient was seen by Vincent Gros, FNP- C in Collaboration with Dr Lyndon Code as a part of collaborative care agreement   Orders Placed This Encounter  Procedures  . POCT HgB A1C    Meds ordered this encounter  Medications  . hydrochlorothiazide (HYDRODIURIL) 12.5 MG tablet    Sig: Take 1 tablet (12.5 mg total) by mouth daily.    Dispense:  30 tablet    Refill:  3    Order Specific Question:   Supervising Provider    Answer:   Lyndon Code [[id number]]  . phentermine (ADIPEX-P) 37.5 MG tablet    Sig: Take 1 tablet (37.5 mg total) by mouth daily before breakfast.    Dispense:  30 tablet    Refill:  1    Order Specific Question:   Supervising Provider    Answer:   Lyndon Code [[id number]]    Time spent: 65 Minutes    Dr Lyndon Code Internal medicine

## 2018-03-04 DIAGNOSIS — E1165 Type 2 diabetes mellitus with hyperglycemia: Secondary | ICD-10-CM | POA: Insufficient documentation

## 2018-03-04 DIAGNOSIS — E668 Other obesity: Secondary | ICD-10-CM | POA: Insufficient documentation

## 2018-03-04 DIAGNOSIS — I10 Essential (primary) hypertension: Secondary | ICD-10-CM

## 2018-03-27 DIAGNOSIS — H5213 Myopia, bilateral: Secondary | ICD-10-CM | POA: Diagnosis not present

## 2018-04-18 ENCOUNTER — Ambulatory Visit: Payer: Self-pay | Admitting: Nurse Practitioner

## 2018-04-22 ENCOUNTER — Other Ambulatory Visit: Payer: Self-pay

## 2018-04-22 DIAGNOSIS — E559 Vitamin D deficiency, unspecified: Secondary | ICD-10-CM

## 2018-04-22 MED ORDER — ERGOCALCIFEROL 1.25 MG (50000 UT) PO CAPS: 50000.0000 [IU] | ORAL_CAPSULE | ORAL | 1 refills | Status: DC

## 2018-05-05 IMAGING — MG MM DIGITAL SCREENING BILAT W/ TOMO W/ CAD
8 of 15 series · 8 of 31 positions shown · non-contrast
Comparison: Previous exam(s).

ACR Breast Density Category a: The breast tissue is almost entirely
fatty.

CLINICAL DATA: Screening.

EXAM:
2D DIGITAL SCREENING BILATERAL MAMMOGRAM WITH CAD AND ADJUNCT TOMO

[L CV]
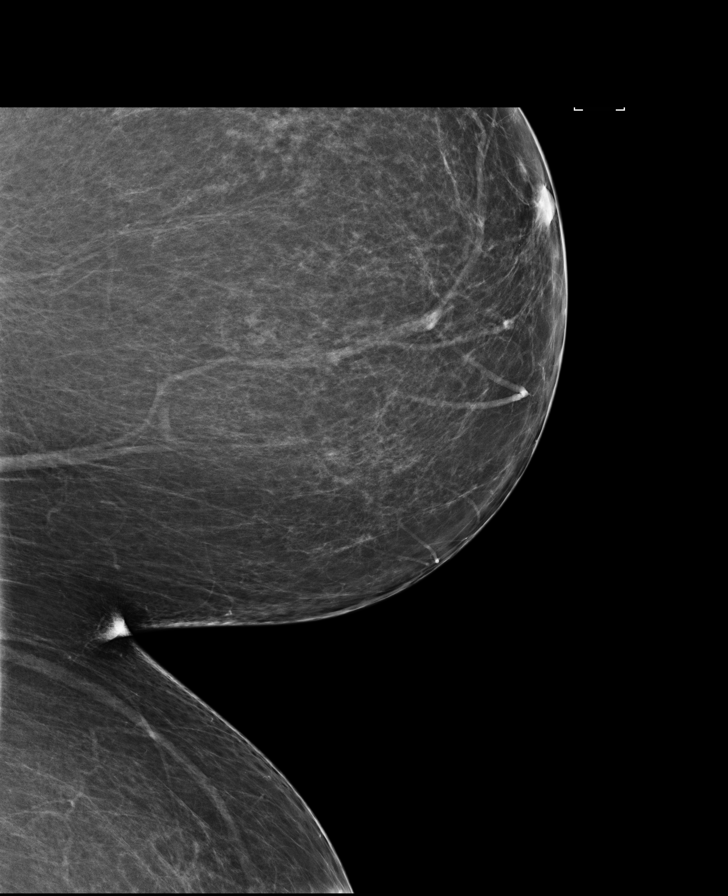

[L MLO]
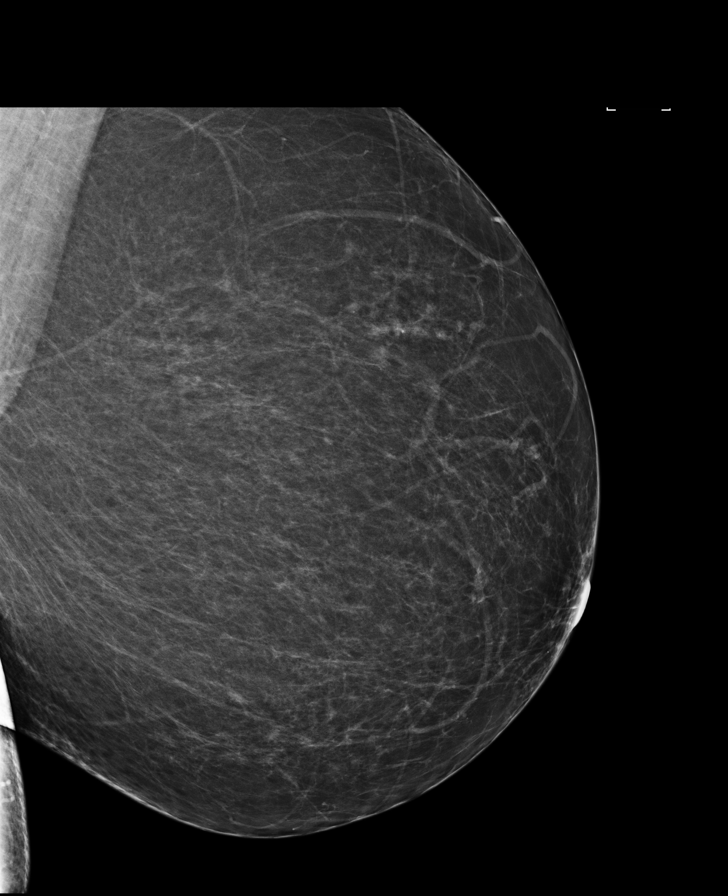

[L CC]
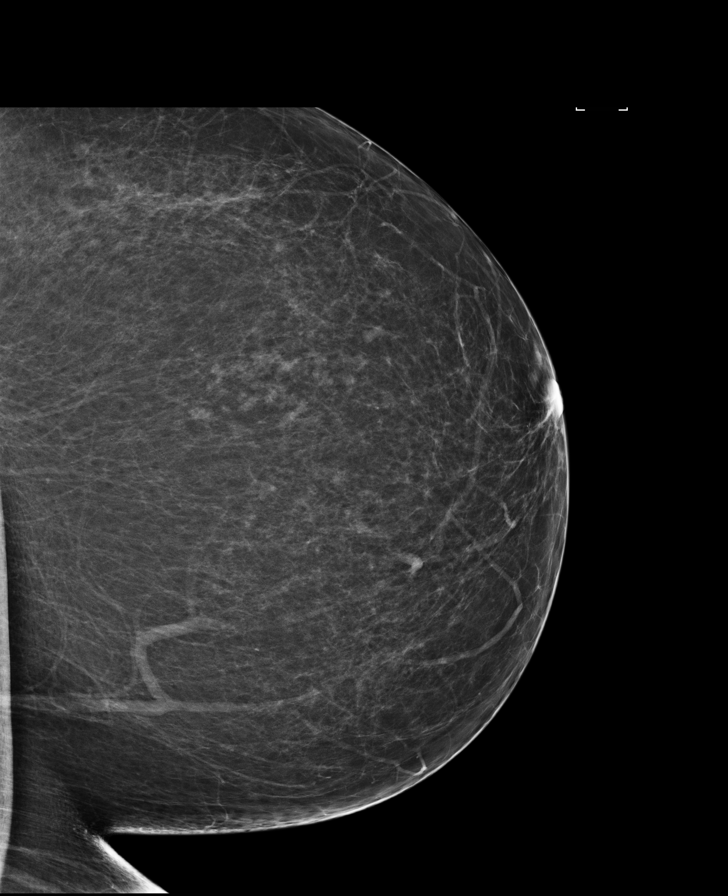

[R MLO]
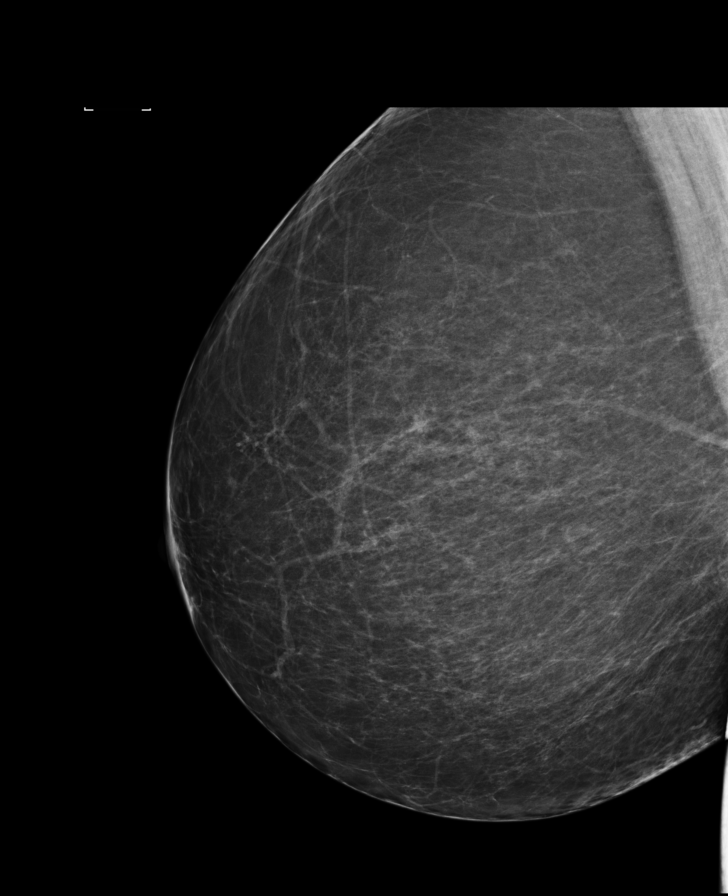

[L CC synth-2D]
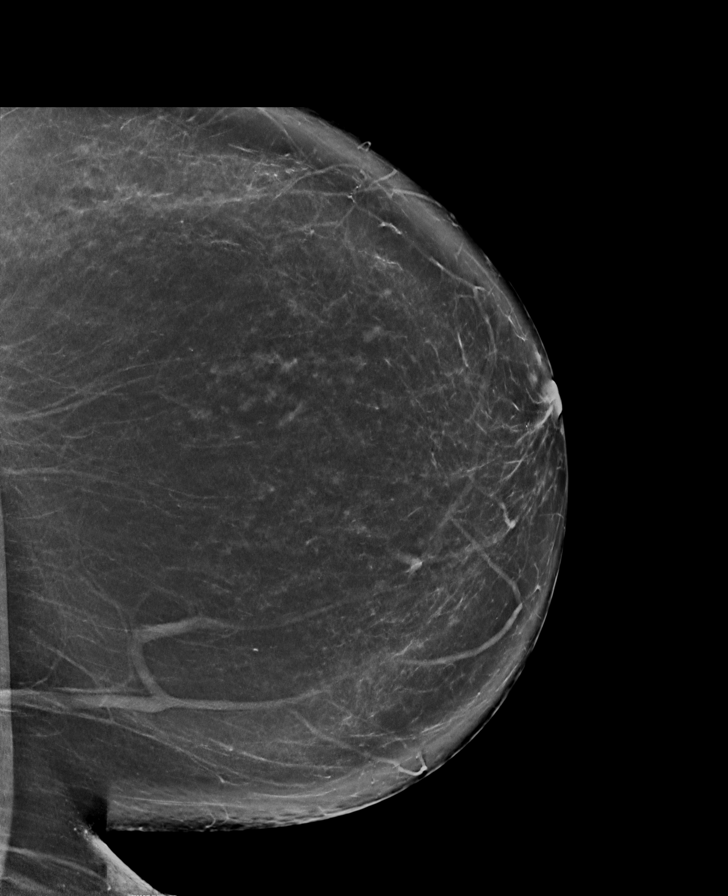

[R MLO synth-2D]
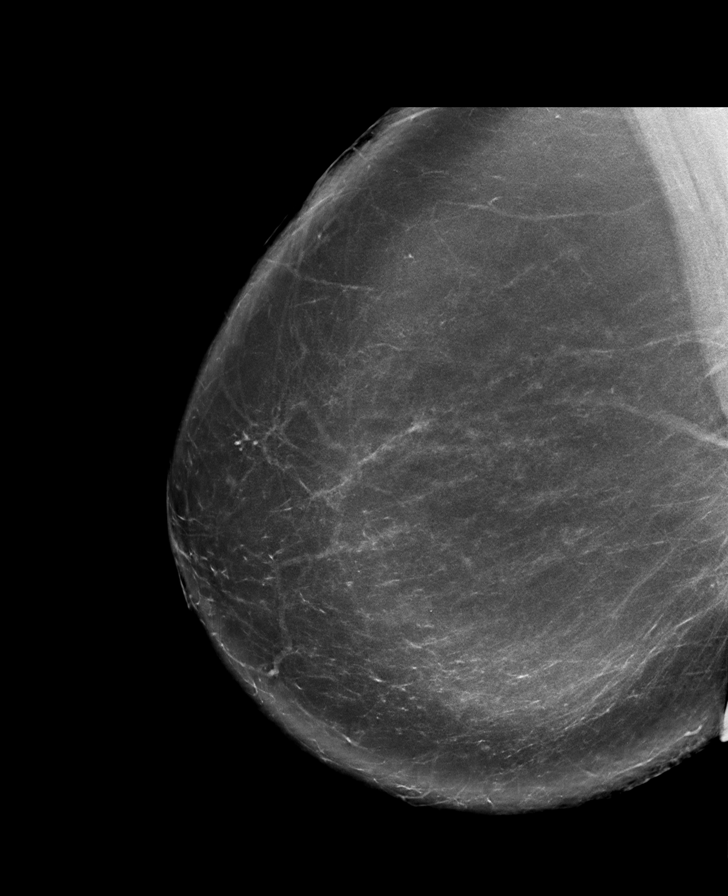

[R CC synth-2D]
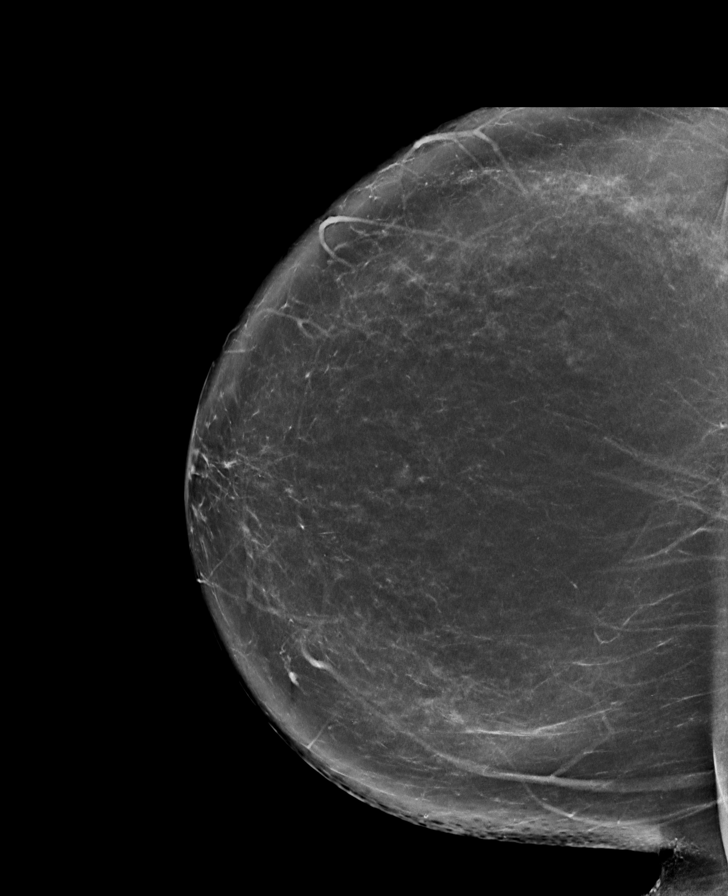

[L MLO synth-2D]
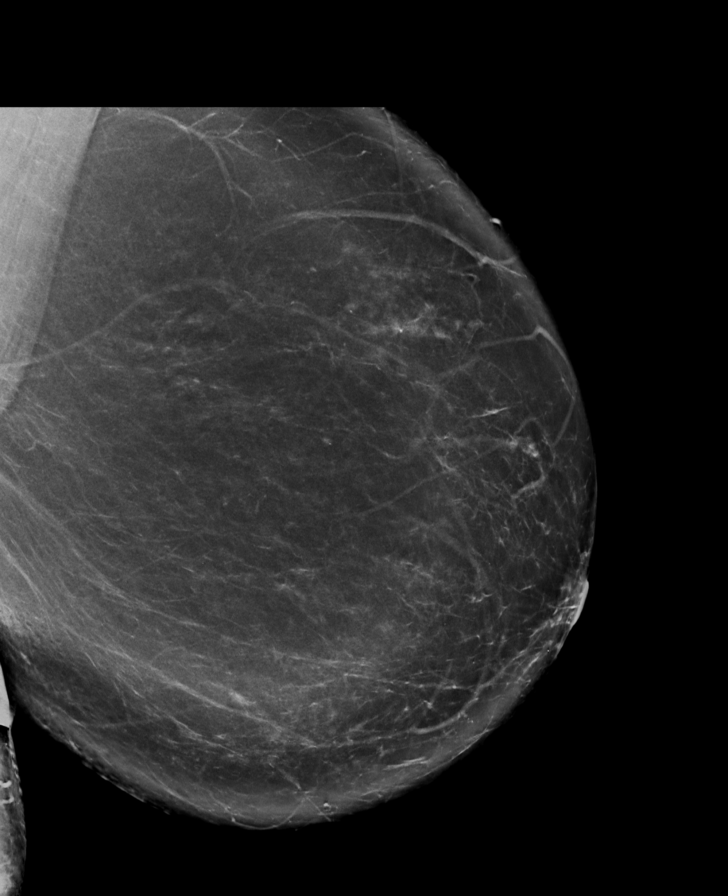

[8 of 31 positions shown; findings below may reference images not displayed]

FINDINGS: In the right breast, a possible mass warrants further evaluation.
This possible mass is seen within the retroareolar right breast,
tomosynthesis CC slice 42 and MLO slice 55.

In the left breast, no findings suspicious for malignancy. Images
were processed with CAD.
IMPRESSION: Further evaluation is suggested for possible mass in the right
breast.

RECOMMENDATION:
Diagnostic mammogram and possibly ultrasound of the right breast.
(Code:[id number])

The patient will be contacted regarding the findings, and additional
imaging will be scheduled.

BI-RADS CATEGORY  0: Incomplete. Need additional imaging evaluation
and/or prior mammograms for comparison.

## 2018-05-13 ENCOUNTER — Other Ambulatory Visit: Payer: Self-pay

## 2018-05-13 DIAGNOSIS — R062 Wheezing: Secondary | ICD-10-CM

## 2018-05-13 MED ORDER — ALBUTEROL SULFATE HFA 108 (90 BASE) MCG/ACT IN AERS: 2.0000 | INHALATION_SPRAY | Freq: Four times a day (QID) | RESPIRATORY_TRACT | 5 refills | Status: DC | PRN

## 2018-05-26 ENCOUNTER — Other Ambulatory Visit: Payer: Self-pay | Admitting: Internal Medicine

## 2018-06-19 ENCOUNTER — Other Ambulatory Visit: Payer: Self-pay | Admitting: Nurse Practitioner

## 2018-06-19 DIAGNOSIS — E559 Vitamin D deficiency, unspecified: Secondary | ICD-10-CM

## 2018-06-19 MED ORDER — ERGOCALCIFEROL 1.25 MG (50000 UT) PO CAPS: 50000.0000 [IU] | ORAL_CAPSULE | ORAL | 1 refills | Status: DC

## 2018-06-21 ENCOUNTER — Other Ambulatory Visit: Payer: Self-pay

## 2018-06-21 DIAGNOSIS — I10 Essential (primary) hypertension: Secondary | ICD-10-CM

## 2018-06-21 MED ORDER — HYDROCHLOROTHIAZIDE 12.5 MG PO TABS: 12.5000 mg | ORAL_TABLET | Freq: Every day | ORAL | 3 refills | Status: DC

## 2018-08-31 ENCOUNTER — Other Ambulatory Visit: Payer: Self-pay | Admitting: Internal Medicine

## 2018-09-11 ENCOUNTER — Ambulatory Visit: Payer: Self-pay | Admitting: Adult Health

## 2018-10-11 ENCOUNTER — Ambulatory Visit: Payer: BC Managed Care – PPO | Admitting: Adult Health

## 2018-10-11 ENCOUNTER — Encounter: Payer: Self-pay | Admitting: Adult Health

## 2018-10-11 VITALS — BP 154/81 | HR 77 | Resp 16 | Ht 69.0 in | Wt 231.6 lb

## 2018-10-11 DIAGNOSIS — E1165 Type 2 diabetes mellitus with hyperglycemia: Secondary | ICD-10-CM

## 2018-10-11 DIAGNOSIS — E668 Other obesity: Secondary | ICD-10-CM | POA: Diagnosis not present

## 2018-10-11 DIAGNOSIS — I10 Essential (primary) hypertension: Secondary | ICD-10-CM | POA: Diagnosis not present

## 2018-10-11 DIAGNOSIS — Z1239 Encounter for other screening for malignant neoplasm of breast: Secondary | ICD-10-CM | POA: Diagnosis not present

## 2018-10-11 LAB — POCT GLYCOSYLATED HEMOGLOBIN (HGB A1C): Hemoglobin A1C: 8.4 % — AB (ref 4.0–5.6)

## 2018-10-11 MED ORDER — LIRAGLUTIDE 18 MG/3ML ~~LOC~~ SOPN: 1.2000 mg | PEN_INJECTOR | Freq: Every day | SUBCUTANEOUS | 1 refills | Status: DC

## 2018-10-11 NOTE — Patient Instructions (Signed)
Diabetes Mellitus and Nutrition, Adult  When you have diabetes (diabetes mellitus), it is very important to have healthy eating habits because your blood sugar (glucose) levels are greatly affected by what you eat and drink. Eating healthy foods in the appropriate amounts, at about the same times every day, can help you:  · Control your blood glucose.  · Lower your risk of heart disease.  · Improve your blood pressure.  · Reach or maintain a healthy weight.  Every person with diabetes is different, and each person has different needs for a meal plan. Your health care provider may recommend that you work with a diet and nutrition specialist (dietitian) to make a meal plan that is best for you. Your meal plan may vary depending on factors such as:  · The calories you need.  · The medicines you take.  · Your weight.  · Your blood glucose, blood pressure, and cholesterol levels.  · Your activity level.  · Other health conditions you have, such as heart or kidney disease.  How do carbohydrates affect me?  Carbohydrates, also called carbs, affect your blood glucose level more than any other type of food. Eating carbs naturally raises the amount of glucose in your blood. Carb counting is a method for keeping track of how many carbs you eat. Counting carbs is important to keep your blood glucose at a healthy level, especially if you use insulin or take certain oral diabetes medicines.  It is important to know how many carbs you can safely have in each meal. This is different for every person. Your dietitian can help you calculate how many carbs you should have at each meal and for each snack.  Foods that contain carbs include:  · Bread, cereal, rice, pasta, and crackers.  · Potatoes and corn.  · Peas, beans, and lentils.  · Milk and yogurt.  · Fruit and juice.  · Desserts, such as cakes, cookies, ice cream, and candy.  How does alcohol affect me?  Alcohol can cause a sudden decrease in blood glucose (hypoglycemia),  especially if you use insulin or take certain oral diabetes medicines. Hypoglycemia can be a life-threatening condition. Symptoms of hypoglycemia (sleepiness, dizziness, and confusion) are similar to symptoms of having too much alcohol.  If your health care provider says that alcohol is safe for you, follow these guidelines:  · Limit alcohol intake to no more than 1 drink per day for nonpregnant women and 2 drinks per day for men. One drink equals 12 oz of beer, 5 oz of wine, or 1½ oz of hard liquor.  · Do not drink on an empty stomach.  · Keep yourself hydrated with water, diet soda, or unsweetened iced tea.  · Keep in mind that regular soda, juice, and other mixers may contain a lot of sugar and must be counted as carbs.  What are tips for following this plan?    Reading food labels  · Start by checking the serving size on the "Nutrition Facts" label of packaged foods and drinks. The amount of calories, carbs, fats, and other nutrients listed on the label is based on one serving of the item. Many items contain more than one serving per package.  · Check the total grams (g) of carbs in one serving. You can calculate the number of servings of carbs in one serving by dividing the total carbs by 15. For example, if a food has 30 g of total carbs, it would be equal to 2   servings of carbs.  · Check the number of grams (g) of saturated and trans fats in one serving. Choose foods that have low or no amount of these fats.  · Check the number of milligrams (mg) of salt (sodium) in one serving. Most people should limit total sodium intake to less than 2,300 mg per day.  · Always check the nutrition information of foods labeled as "low-fat" or "nonfat". These foods may be higher in added sugar or refined carbs and should be avoided.  · Talk to your dietitian to identify your daily goals for nutrients listed on the label.  Shopping  · Avoid buying canned, premade, or processed foods. These foods tend to be high in fat, sodium,  and added sugar.  · Shop around the outside edge of the grocery store. This includes fresh fruits and vegetables, bulk grains, fresh meats, and fresh dairy.  Cooking  · Use low-heat cooking methods, such as baking, instead of high-heat cooking methods like deep frying.  · Cook using healthy oils, such as olive, canola, or sunflower oil.  · Avoid cooking with butter, cream, or high-fat meats.  Meal planning  · Eat meals and snacks regularly, preferably at the same times every day. Avoid going long periods of time without eating.  · Eat foods high in fiber, such as fresh fruits, vegetables, beans, and whole grains. Talk to your dietitian about how many servings of carbs you can eat at each meal.  · Eat 4-6 ounces (oz) of lean protein each day, such as lean meat, chicken, fish, eggs, or tofu. One oz of lean protein is equal to:  ? 1 oz of meat, chicken, or fish.  ? 1 egg.  ? ¼ cup of tofu.  · Eat some foods each day that contain healthy fats, such as avocado, nuts, seeds, and fish.  Lifestyle  · Check your blood glucose regularly.  · Exercise regularly as told by your health care provider. This may include:  ? 150 minutes of moderate-intensity or vigorous-intensity exercise each week. This could be brisk walking, biking, or water aerobics.  ? Stretching and doing strength exercises, such as yoga or weightlifting, at least 2 times a week.  · Take medicines as told by your health care provider.  · Do not use any products that contain nicotine or tobacco, such as cigarettes and e-cigarettes. If you need help quitting, ask your health care provider.  · Work with a counselor or diabetes educator to identify strategies to manage stress and any emotional and social challenges.  Questions to ask a health care provider  · Do I need to meet with a diabetes educator?  · Do I need to meet with a dietitian?  · What number can I call if I have questions?  · When are the best times to check my blood glucose?  Where to find more  information:  · American Diabetes Association: diabetes.org  · Academy of Nutrition and Dietetics: www.eatright.org  · National Institute of Diabetes and Digestive and Kidney Diseases (NIH): www.niddk.nih.gov  Summary  · A healthy meal plan will help you control your blood glucose and maintain a healthy lifestyle.  · Working with a diet and nutrition specialist (dietitian) can help you make a meal plan that is best for you.  · Keep in mind that carbohydrates (carbs) and alcohol have immediate effects on your blood glucose levels. It is important to count carbs and to use alcohol carefully.  This information is not intended to   replace advice given to you by your health care provider. Make sure you discuss any questions you have with your health care provider.  Document Released: 06/22/2005 Document Revised: 04/25/2017 Document Reviewed: 10/30/2016  [employer]vier Interactive Patient Education © 2019 [employer]

## 2018-10-11 NOTE — Progress Notes (Signed)
[facility] [address] [place], [place] [address]  Internal MEDICINE  Office Visit Note  Patient Name: Jordan Mccoy  [id number]  [id number]  Date of Service: 10/11/2018  Chief Complaint  Patient presents with  . Follow-up  . Hypertension  . Quality Metric Gaps    mammogram, a1c    HPI  Patient is here for follow-up on hypertension, DM, and weight loss. Her  Blood pressure is slightly elevated at today's visit.  She denies any headache, chest pain, sob or palpitations.  She is showing a 7 pound weight gain since her last visit.  She reports that she has been out of her phentermine for over 2 months.  Her A1c today is 8.4 which is increased from 6.9.  Patient reports that she has not been eating very well and she will get back on her diet now that she knows her levels have increased.  She is also in need of a mammogram to close her quality metric gaps.   Current Medication: Outpatient Encounter Medications as of 10/11/2018  Medication Sig  . ADVAIR DISKUS 250-50 MCG/DOSE AEPB INHALE 1 PUFF(S) TWICE A DAY FOR ASTHMA  . albuterol (VENTOLIN HFA) 108 (90 Base) MCG/ACT inhaler Inhale 2 puffs into the lungs every 6 (six) hours as needed for wheezing or shortness of breath.  . bisoprolol-hydrochlorothiazide (ZIAC) 10-6.25 MG tablet TAKE 1 TABLET BY MOUTH EVERY DAY  . hydrochlorothiazide (HYDRODIURIL) 12.5 MG tablet Take 1 tablet (12.5 mg total) by mouth daily.  . ergocalciferol (DRISDOL) 50000 units capsule Take 1 capsule (50,000 Units total) by mouth once a week. (Patient not taking: Reported on 10/11/2018)  . phentermine (ADIPEX-P) 37.5 MG tablet Take 1 tablet (37.5 mg total) by mouth daily before breakfast. (Patient not taking: Reported on 10/11/2018)   No facility-administered encounter medications on file as of 10/11/2018.     Surgical History: Past Surgical History:  Procedure Laterality Date  . ABDOMINAL HYSTERECTOMY    . CHOLECYSTECTOMY    . right foot surgery       Medical History: Past Medical History:  Diagnosis Date  . Hypertension     Family History: Family History  Problem Relation Age of Onset  . Breast cancer Mother 81  . Breast cancer Maternal Aunt 36    Social History   Socioeconomic History  . Marital status: Single    Spouse name: Not on file  . Number of children: Not on file  . Years of education: Not on file  . Highest education level: Not on file  Occupational History  . Not on file  Social Needs  . Financial resource strain: Not on file  . Food insecurity:    Worry: Not on file    Inability: Not on file  . Transportation needs:    Medical: Not on file    Non-medical: Not on file  Tobacco Use  . Smoking status: Never Smoker  . Smokeless tobacco: Never Used  Substance and Sexual Activity  . Alcohol use: Yes    Frequency: Never    Comment: social  . Drug use: No  . Sexual activity: Not on file  Lifestyle  . Physical activity:    Days per week: Not on file    Minutes per session: Not on file  . Stress: Not on file  Relationships  . Social connections:    Talks on phone: Not on file    Gets together: Not on file    Attends religious service: Not on file  Active member of club or organization: Not on file    Attends meetings of clubs or organizations: Not on file    Relationship status: Not on file  . Intimate partner violence:    Fear of current or ex partner: Not on file    Emotionally abused: Not on file    Physically abused: Not on file    Forced sexual activity: Not on file  Other Topics Concern  . Not on file  Social History Narrative  . Not on file      Review of Systems  Constitutional: Negative for chills, fatigue and unexpected weight change.  HENT: Negative for congestion, rhinorrhea, sneezing and sore throat.   Eyes: Negative for photophobia, pain and redness.  Respiratory: Negative for cough, chest tightness and shortness of breath.   Cardiovascular: Negative for chest pain  and palpitations.  Gastrointestinal: Negative for abdominal pain, constipation, diarrhea, nausea and vomiting.  Endocrine: Negative.   Genitourinary: Negative for dysuria and frequency.  Musculoskeletal: Negative for arthralgias, back pain, joint swelling and neck pain.  Skin: Negative for rash.  Allergic/Immunologic: Negative.   Neurological: Negative for tremors and numbness.  Hematological: Negative for adenopathy. Does not bruise/bleed easily.  Psychiatric/Behavioral: Negative for behavioral problems and sleep disturbance. The patient is not nervous/anxious.     Vital Signs: BP (!) 154/81   Pulse 77   Resp 16   Ht 5\' 9"  (1.753 m)   Wt 231 lb 9.6 oz (105.1 kg)   SpO2 99%   BMI 34.20 kg/m    Physical Exam Vitals signs and nursing note reviewed.  Constitutional:      General: She is not in acute distress.    Appearance: She is well-developed. She is not diaphoretic.  HENT:     Head: Normocephalic and atraumatic.     Mouth/Throat:     Pharynx: No oropharyngeal exudate.  Eyes:     Pupils: Pupils are equal, round, and reactive to light.  Neck:     Musculoskeletal: Normal range of motion and neck supple.     Thyroid: No thyromegaly.     Vascular: No JVD.     Trachea: No tracheal deviation.  Cardiovascular:     Rate and Rhythm: Normal rate and regular rhythm.     Heart sounds: Normal heart sounds. No murmur. No friction rub. No gallop.   Pulmonary:     Effort: Pulmonary effort is normal. No respiratory distress.     Breath sounds: Normal breath sounds. No wheezing or rales.  Chest:     Chest wall: No tenderness.  Abdominal:     Palpations: Abdomen is soft.     Tenderness: There is no abdominal tenderness. There is no guarding.  Musculoskeletal: Normal range of motion.  Lymphadenopathy:     Cervical: No cervical adenopathy.  Skin:    General: Skin is warm and dry.  Neurological:     Mental Status: She is alert and oriented to person, place, and time.     Cranial  Nerves: No cranial nerve deficit.  Psychiatric:        Behavior: Behavior normal.        Thought Content: Thought content normal.        Judgment: Judgment normal.        Assessment/Plan: 1. Uncontrolled type 2 diabetes mellitus with hyperglycemia (HCC) Patient's A1c increased this visit.  Will start patient on Victoza to assist with A1c control as well as some weight loss. - POCT HgB A1C - liraglutide (VICTOZA)  18 MG/3ML SOPN; Inject 0.2 mLs (1.2 mg total) into the skin daily.  Dispense: 5 pen; Refill: 1  2. Essential hypertension Blood pressure slightly elevated at this time.  Patient where she is not taking her blood pressure medicines today.  I encouraged patient to take her medications when she got home.  We will continue to monitor at future visits.  3. Screening for breast cancer Diagnostic mammogram ordered for patient due to family history. - MM Digital Diagnostic Bilat; Future  4. Moderate obesity Obesity Counseling: Risk Assessment: An assessment of behavioral risk factors was made today and includes lack of exercise sedentary lifestyle, lack of portion control and poor dietary habits.  Risk Modification Advice: She was counseled on portion control guidelines. Restricting daily caloric intake to. . The detrimental long term effects of obesity on her health and ongoing poor compliance was also discussed with the patient.    General Counseling: Nancy NordmannCynthia verbalizes understanding of the findings of todays visit and agrees with plan of treatment. I have discussed any further diagnostic evaluation that may be needed or ordered today. We also reviewed her medications today. she has been encouraged to call the office with any questions or concerns that should arise related to todays visit.    Orders Placed This Encounter  Procedures  . POCT HgB A1C    No orders of the defined types were placed in this encounter.   Time spent: 25 Minutes   This patient was seen by Blima LedgerAdam  Dhwani Venkatesh AGNP-C in Collaboration with Dr Lyndon CodeFozia M Khan as a part of collaborative care agreement     Johnna AcostaAdam J. Louise Victory AGNP-C Internal medicine

## 2018-10-16 ENCOUNTER — Other Ambulatory Visit: Payer: Self-pay | Admitting: Nurse Practitioner

## 2018-10-16 ENCOUNTER — Other Ambulatory Visit: Payer: Self-pay | Admitting: Adult Health

## 2018-10-16 DIAGNOSIS — Z1239 Encounter for other screening for malignant neoplasm of breast: Secondary | ICD-10-CM

## 2018-10-16 DIAGNOSIS — N63 Unspecified lump in unspecified breast: Secondary | ICD-10-CM

## 2018-10-18 ENCOUNTER — Telehealth: Payer: Self-pay

## 2018-10-18 ENCOUNTER — Other Ambulatory Visit: Payer: Self-pay

## 2018-10-18 MED ORDER — INSULIN PEN NEEDLE 32G X 4 MM MISC: 3 refills | Status: DC

## 2018-10-18 NOTE — Telephone Encounter (Signed)
Lmom we send [facility]

## 2018-10-22 DIAGNOSIS — G8929 Other chronic pain: Secondary | ICD-10-CM | POA: Diagnosis not present

## 2018-10-22 DIAGNOSIS — M79674 Pain in right toe(s): Secondary | ICD-10-CM | POA: Diagnosis not present

## 2018-10-22 DIAGNOSIS — M7661 Achilles tendinitis, right leg: Secondary | ICD-10-CM | POA: Diagnosis not present

## 2018-10-31 ENCOUNTER — Ambulatory Visit
Admission: RE | Admit: 2018-10-31 | Discharge: 2018-10-31 | Disposition: A | Discharge status: Home / Self Care | Payer: BLUE CROSS/BLUE SHIELD | Source: Ambulatory Visit | Attending: Nurse Practitioner | Admitting: Nurse Practitioner

## 2018-10-31 DIAGNOSIS — N63 Unspecified lump in unspecified breast: Secondary | ICD-10-CM | POA: Insufficient documentation

## 2018-10-31 DIAGNOSIS — R928 Other abnormal and inconclusive findings on diagnostic imaging of breast: Secondary | ICD-10-CM | POA: Diagnosis not present

## 2018-11-22 ENCOUNTER — Other Ambulatory Visit: Payer: Self-pay

## 2018-11-22 DIAGNOSIS — I10 Essential (primary) hypertension: Secondary | ICD-10-CM

## 2018-11-22 MED ORDER — HYDROCHLOROTHIAZIDE 12.5 MG PO TABS: 12.5000 mg | ORAL_TABLET | Freq: Every day | ORAL | 3 refills | Status: DC

## 2018-12-05 ENCOUNTER — Other Ambulatory Visit: Payer: Self-pay | Admitting: Internal Medicine

## 2018-12-05 DIAGNOSIS — R062 Wheezing: Secondary | ICD-10-CM

## 2019-01-13 ENCOUNTER — Ambulatory Visit: Payer: Self-pay | Admitting: Adult Health

## 2019-01-15 ENCOUNTER — Ambulatory Visit: Payer: Self-pay | Admitting: Nurse Practitioner

## 2019-01-20 ENCOUNTER — Ambulatory Visit: Payer: BC Managed Care – PPO | Admitting: Nurse Practitioner

## 2019-01-20 ENCOUNTER — Other Ambulatory Visit: Payer: Self-pay

## 2019-01-20 ENCOUNTER — Encounter: Payer: Self-pay | Admitting: Nurse Practitioner

## 2019-01-20 VITALS — BP 124/78 | HR 93 | Resp 16 | Ht 69.0 in | Wt 224.0 lb

## 2019-01-20 DIAGNOSIS — E668 Other obesity: Secondary | ICD-10-CM

## 2019-01-20 DIAGNOSIS — I10 Essential (primary) hypertension: Secondary | ICD-10-CM

## 2019-01-20 DIAGNOSIS — E1165 Type 2 diabetes mellitus with hyperglycemia: Secondary | ICD-10-CM

## 2019-01-20 LAB — POCT GLYCOSYLATED HEMOGLOBIN (HGB A1C): Hemoglobin A1C: 7 % — AB (ref 4.0–5.6)

## 2019-01-20 MED ORDER — PHENTERMINE HCL 37.5 MG PO TABS: 37.5000 mg | ORAL_TABLET | Freq: Every day | ORAL | 1 refills | Status: DC

## 2019-01-20 NOTE — Progress Notes (Signed)
[facility] [address] [place], [place] [address]  Internal MEDICINE  Office Visit Note  Patient Name: Jordan Mccoy  [id number]  [id number]  Date of Service: 01/22/2019  Chief Complaint  Patient presents with  . Diabetes  . Hypertension    The patient has been taking phentermine 37.5mg  tablets off and on since her last visit. She has done very well with this. She has lost 7 pounds since she was last seen and has no negative side effects t report. She would like to continue this more regularly. Her blood pressure is very well controlled and her blood sugars have improved since her last visit .  Hypertension  This is a chronic problem. The current episode started more than 1 year ago. The problem is unchanged. The problem is resistant. Associated symptoms include peripheral edema. Pertinent negatives include no chest pain, headaches, neck pain, palpitations or shortness of breath. Risk factors for coronary artery disease include family history, diabetes mellitus and obesity. Past treatments include beta blockers and diuretics. The current treatment provides moderate improvement. Compliance problems include exercise.   Diabetes  She presents for her follow-up diabetic visit. She has type 2 diabetes mellitus. No MedicAlert identification noted. Her disease course has been stable. There are no hypoglycemic associated symptoms. Pertinent negatives for hypoglycemia include no headaches, nervousness/anxiousness or tremors. There are no diabetic associated symptoms. Pertinent negatives for diabetes include no chest pain, no fatigue, no polydipsia, no polyphagia and no polyuria. Symptoms are stable. There are no diabetic complications. Risk factors for coronary artery disease include diabetes mellitus, dyslipidemia and hypertension. Current diabetic treatment includes diet. She is compliant with treatment all of the time. She is following a generally healthy diet. Meal planning includes  avoidance of concentrated sweets. She has not had a previous visit with a dietitian. She participates in exercise intermittently. An ACE inhibitor/angiotensin II receptor blocker is not being taken. She does not see a podiatrist.Eye exam is not current.       Current Medication: Outpatient Encounter Medications as of 01/20/2019  Medication Sig  . ADVAIR DISKUS 250-50 MCG/DOSE AEPB INHALE 1 PUFF(S) TWICE A DAY FOR ASTHMA  . bisoprolol-hydrochlorothiazide (ZIAC) 10-6.25 MG tablet TAKE 1 TABLET BY MOUTH EVERY DAY  . Insulin Pen Needle (BD PEN NEEDLE NANO U/F) 32G X 4 MM MISC Use as directed with victoza  . liraglutide (VICTOZA) 18 MG/3ML SOPN Inject 0.2 mLs (1.2 mg total) into the skin daily.  . VENTOLIN HFA 108 (90 Base) MCG/ACT inhaler TAKE 2 PUFFS BY MOUTH EVERY 6 HOURS AS NEEDED FOR WHEEZE OR SHORTNESS OF BREATH  . hydrochlorothiazide (HYDRODIURIL) 12.5 MG tablet Take 1 tablet (12.5 mg total) by mouth daily. (Patient not taking: Reported on 01/20/2019)  . phentermine (ADIPEX-P) 37.5 MG tablet Take 1 tablet (37.5 mg total) by mouth daily before breakfast.  . [DISCONTINUED] ergocalciferol (DRISDOL) 50000 units capsule Take 1 capsule (50,000 Units total) by mouth once a week. (Patient not taking: Reported on 10/11/2018)  . [DISCONTINUED] phentermine (ADIPEX-P) 37.5 MG tablet Take 1 tablet (37.5 mg total) by mouth daily before breakfast. (Patient not taking: Reported on 10/11/2018)   No facility-administered encounter medications on file as of 01/20/2019.     Surgical History: Past Surgical History:  Procedure Laterality Date  . ABDOMINAL HYSTERECTOMY    . CHOLECYSTECTOMY    . right foot surgery      Medical History: Past Medical History:  Diagnosis Date  . Hypertension     Family History: Family History  Problem Relation Age of Onset  . Breast cancer Mother 34  . Breast cancer Maternal Aunt 16    Social History   Socioeconomic History  . Marital status: Single    Spouse name:  Not on file  . Number of children: Not on file  . Years of education: Not on file  . Highest education level: Not on file  Occupational History  . Not on file  Social Needs  . Financial resource strain: Not on file  . Food insecurity:    Worry: Not on file    Inability: Not on file  . Transportation needs:    Medical: Not on file    Non-medical: Not on file  Tobacco Use  . Smoking status: Never Smoker  . Smokeless tobacco: Never Used  Substance and Sexual Activity  . Alcohol use: Yes    Frequency: Never    Comment: social  . Drug use: No  . Sexual activity: Not on file  Lifestyle  . Physical activity:    Days per week: Not on file    Minutes per session: Not on file  . Stress: Not on file  Relationships  . Social connections:    Talks on phone: Not on file    Gets together: Not on file    Attends religious service: Not on file    Active member of club or organization: Not on file    Attends meetings of clubs or organizations: Not on file    Relationship status: Not on file  . Intimate partner violence:    Fear of current or ex partner: Not on file    Emotionally abused: Not on file    Physically abused: Not on file    Forced sexual activity: Not on file  Other Topics Concern  . Not on file  Social History Narrative  . Not on file      Review of Systems  Constitutional: Negative for chills, fatigue and unexpected weight change.       Seven pound weight loss since her last visit.   HENT: Negative for congestion, rhinorrhea, sneezing and sore throat.   Respiratory: Negative for cough, chest tightness and shortness of breath.   Cardiovascular: Negative for chest pain and palpitations.  Gastrointestinal: Negative for abdominal pain, constipation, diarrhea, nausea and vomiting.  Endocrine: Negative.  Negative for polydipsia, polyphagia and polyuria.  Genitourinary: Negative for dysuria and frequency.  Musculoskeletal: Negative for arthralgias, back pain, joint  swelling and neck pain.  Skin: Negative for rash.  Allergic/Immunologic: Negative for environmental allergies.  Neurological: Negative for tremors, numbness and headaches.  Hematological: Negative for adenopathy. Does not bruise/bleed easily.  Psychiatric/Behavioral: Negative for behavioral problems and sleep disturbance. The patient is not nervous/anxious.     Today's Vitals   01/20/19 1505  BP: 124/78  Pulse: 93  Resp: 16  SpO2: 96%  Weight: 224 lb (101.6 kg)  Height:  (1.753 m)   Body mass index is 33.08 kg/m.  Physical Exam Vitals signs and nursing note reviewed.  Constitutional:      General: She is not in acute distress.    Appearance: Normal appearance. She is well-developed. She is not diaphoretic.  HENT:     Head: Normocephalic and atraumatic.     Mouth/Throat:     Pharynx: No oropharyngeal exudate.  Eyes:     Pupils: Pupils are equal, round, and reactive to light.  Neck:     Musculoskeletal: Normal range of motion and neck supple.  Thyroid: No thyromegaly.     Vascular: No carotid bruit or JVD.     Trachea: No tracheal deviation.  Cardiovascular:     Rate and Rhythm: Normal rate and regular rhythm.     Heart sounds: Normal heart sounds. No murmur. No friction rub. No gallop.   Pulmonary:     Effort: Pulmonary effort is normal. No respiratory distress.     Breath sounds: Normal breath sounds. No wheezing or rales.  Chest:     Chest wall: No tenderness.  Abdominal:     Tenderness: There is no abdominal tenderness.  Musculoskeletal: Normal range of motion.  Lymphadenopathy:     Cervical: No cervical adenopathy.  Skin:    General: Skin is warm and dry.  Neurological:     Mental Status: She is alert and oriented to person, place, and time.     Cranial Nerves: No cranial nerve deficit.  Psychiatric:        Behavior: Behavior normal.        Thought Content: Thought content normal.        Judgment: Judgment normal.   Assessment/Plan: 1.  Uncontrolled type 2 diabetes mellitus with hyperglycemia (HCC) - POCT HgB A1C 7.0 today, improved from 8.4 at her last check. Continue all diabetic medication as prescribed   2. Essential hypertension Stable. Continue bp medication as prescribed   3. Moderate obesity Improved. May continue phentermine 37.5mg  tablets every day. Limit calorie intake to 1200-1500 calories per day. Incorporate exercise into daily routine.  - phentermine (ADIPEX-P) 37.5 MG tablet; Take 1 tablet (37.5 mg total) by mouth daily before breakfast.  Dispense: 30 tablet; Refill: 1  General Counseling: Aram BeechamCynthia verbalizes understanding of the findings of todays visit and agrees with plan of treatment. I have discussed any further diagnostic evaluation that may be needed or ordered today. We also reviewed her medications today. she has been encouraged to call the office with any questions or concerns that should arise related to todays visit.  Diabetes Counseling:  1. Addition of ACE inh/ ARB'S for nephroprotection. Microalbumin is updated  2. Diabetic foot care, prevention of complications. Podiatry consult 3. Exercise and lose weight.  4. Diabetic eye examination, Diabetic eye exam is updated  5. Monitor blood sugar closlely. nutrition counseling.  6. Sign and symptoms of hypoglycemia including shaking sweating,confusion and headaches.   There is a liability release in patients' chart. There has been a 10 minute discussion about the side effects including but not limited to elevated blood pressure, anxiety, lack of sleep and dry mouth. Pt understands and will like to start/continue on appetite suppressant at this time. There will be one month RX given at the time of visit with proper follow up. Nova diet plan with restricted calories is given to the pt. Pt understands and agrees with  plan of treatment  This patient was seen by Vincent GrosHeather Yamilet Mcfayden FNP Collaboration with Dr Lyndon CodeFozia M Khan as a part of collaborative care  agreement  Orders Placed This Encounter  Procedures  . POCT HgB A1C    Meds ordered this encounter  Medications  . phentermine (ADIPEX-P) 37.5 MG tablet    Sig: Take 1 tablet (37.5 mg total) by mouth daily before breakfast.    Dispense:  30 tablet    Refill:  1    Order Specific Question:   Supervising Provider    Answer:   Lyndon CodeKHAN, FOZIA M [[id number]]    Time spent: 1230 Minutes      Dr Shannan HarperFozia M  [facility] Internal medicine

## 2019-02-09 DIAGNOSIS — M7918 Myalgia, other site: Secondary | ICD-10-CM | POA: Diagnosis not present

## 2019-02-09 DIAGNOSIS — M778 Other enthesopathies, not elsewhere classified: Secondary | ICD-10-CM | POA: Diagnosis not present

## 2019-02-17 ENCOUNTER — Ambulatory Visit: Payer: Self-pay | Admitting: Nurse Practitioner

## 2019-03-17 ENCOUNTER — Other Ambulatory Visit: Payer: Self-pay

## 2019-03-17 MED ORDER — BISOPROLOL-HYDROCHLOROTHIAZIDE 10-6.25 MG PO TABS: 1.0000 | ORAL_TABLET | Freq: Every day | ORAL | 6 refills | Status: DC

## 2019-03-26 ENCOUNTER — Other Ambulatory Visit: Payer: Self-pay | Admitting: Adult Health

## 2019-03-26 DIAGNOSIS — E1165 Type 2 diabetes mellitus with hyperglycemia: Secondary | ICD-10-CM

## 2019-04-08 ENCOUNTER — Encounter: Payer: Self-pay | Admitting: Nurse Practitioner

## 2019-04-08 ENCOUNTER — Ambulatory Visit: Payer: BC Managed Care – PPO | Admitting: Nurse Practitioner

## 2019-04-08 ENCOUNTER — Other Ambulatory Visit: Payer: Self-pay

## 2019-04-08 VITALS — BP 132/80 | HR 80 | Resp 16 | Ht 69.0 in | Wt 219.0 lb

## 2019-04-08 DIAGNOSIS — E668 Other obesity: Secondary | ICD-10-CM

## 2019-04-08 DIAGNOSIS — I10 Essential (primary) hypertension: Secondary | ICD-10-CM

## 2019-04-08 DIAGNOSIS — E119 Type 2 diabetes mellitus without complications: Secondary | ICD-10-CM | POA: Diagnosis not present

## 2019-04-08 MED ORDER — PHENTERMINE HCL 37.5 MG PO TABS: 37.5000 mg | ORAL_TABLET | Freq: Every day | ORAL | 1 refills | Status: DC

## 2019-04-08 NOTE — Progress Notes (Signed)
[facility] [address] [place], [place] [id number]  Internal MEDICINE  Office Visit Note  Patient Name: Jordan KassCynthia Brooking  [id number]  [id number]  Date of Service: 04/12/2019  Chief Complaint  Patient presents with  . Medical Management of Chronic Issues    4wk follow up weight management    The patient is here for routine follow up. She had been taking phentermine daily to help with weight loss. Has been out of this for several weeks. Despite this, she has been able to lose weight. Had weight loss of 5 pounds. Blood pressure is slightly elevated today, however, it is generally very well controlled. She has no other negative side effects from taking this medication.       Current Medication: Outpatient Encounter Medications as of 04/08/2019  Medication Sig  . ADVAIR DISKUS 250-50 MCG/DOSE AEPB INHALE 1 PUFF(S) TWICE A DAY FOR ASTHMA  . bisoprolol-hydrochlorothiazide (ZIAC) 10-6.25 MG tablet Take 1 tablet by mouth daily.  . Insulin Pen Needle (BD PEN NEEDLE NANO U/F) 32G X 4 MM MISC Use as directed with victoza  . phentermine (ADIPEX-P) 37.5 MG tablet Take 1 tablet (37.5 mg total) by mouth daily before breakfast.  . VENTOLIN HFA 108 (90 Base) MCG/ACT inhaler TAKE 2 PUFFS BY MOUTH EVERY 6 HOURS AS NEEDED FOR WHEEZE OR SHORTNESS OF BREATH  . VICTOZA 18 MG/3ML SOPN INJECT 1.2 MG UNDER THE SKIN ONCE DAILY  . [DISCONTINUED] phentermine (ADIPEX-P) 37.5 MG tablet Take 1 tablet (37.5 mg total) by mouth daily before breakfast.  . hydrochlorothiazide (HYDRODIURIL) 12.5 MG tablet Take 1 tablet (12.5 mg total) by mouth daily. (Patient not taking: Reported on 01/20/2019)   No facility-administered encounter medications on file as of 04/08/2019.     Surgical History: Past Surgical History:  Procedure Laterality Date  . ABDOMINAL HYSTERECTOMY    . CHOLECYSTECTOMY    . right foot surgery      Medical History: Past Medical History:  Diagnosis Date  . Hypertension     Family  History: Family History  Problem Relation Age of Onset  . Breast cancer Mother 3860  . Breast cancer Maternal Aunt 1263    Social History   Socioeconomic History  . Marital status: Single    Spouse name: Not on file  . Number of children: Not on file  . Years of education: Not on file  . Highest education level: Not on file  Occupational History  . Not on file  Social Needs  . Financial resource strain: Not on file  . Food insecurity    Worry: Not on file    Inability: Not on file  . Transportation needs    Medical: Not on file    Non-medical: Not on file  Tobacco Use  . Smoking status: Never Smoker  . Smokeless tobacco: Never Used  Substance and Sexual Activity  . Alcohol use: Yes    Frequency: Never    Comment: social  . Drug use: No  . Sexual activity: Not on file  Lifestyle  . Physical activity    Days per week: Not on file    Minutes per session: Not on file  . Stress: Not on file  Relationships  . Social Musicianconnections    Talks on phone: Not on file    Gets together: Not on file    Attends religious service: Not on file    Active member of club or organization: Not on file    Attends meetings of clubs or organizations:  Not on file    Relationship status: Not on file  . Intimate partner violence    Fear of current or ex partner: Not on file    Emotionally abused: Not on file    Physically abused: Not on file    Forced sexual activity: Not on file  Other Topics Concern  . Not on file  Social History Narrative  . Not on file      Review of Systems  Constitutional: Negative for chills, fatigue and unexpected weight change.       Five pound weight loss since her last visit.   HENT: Negative for congestion, rhinorrhea, sneezing and sore throat.   Respiratory: Negative for cough, chest tightness and shortness of breath.   Cardiovascular: Negative for chest pain and palpitations.  Gastrointestinal: Negative for abdominal pain, constipation, diarrhea, nausea  and vomiting.  Endocrine: Negative for cold intolerance, heat intolerance, polyphagia and polyuria.  Musculoskeletal: Negative for arthralgias, back pain, joint swelling and neck pain.  Skin: Negative for rash.  Allergic/Immunologic: Negative for environmental allergies.  Neurological: Negative for tremors, numbness and headaches.  Hematological: Negative for adenopathy. Does not bruise/bleed easily.  Psychiatric/Behavioral: Negative for behavioral problems and sleep disturbance. The patient is not nervous/anxious.     Today's Vitals   04/08/19 1547  BP: 132/80  Pulse: 80  Resp: 16  SpO2: 99%  Weight: 219 lb (99.3 kg)  Height: 5\' 9"  (1.753 m)   Body mass index is 32.34 kg/m.  Physical Exam Vitals signs and nursing note reviewed.  Constitutional:      General: She is not in acute distress.    Appearance: Normal appearance. She is well-developed. She is not diaphoretic.  HENT:     Head: Normocephalic and atraumatic.     Mouth/Throat:     Pharynx: No oropharyngeal exudate.  Eyes:     Pupils: Pupils are equal, round, and reactive to light.  Neck:     Musculoskeletal: Normal range of motion and neck supple.     Thyroid: No thyromegaly.     Vascular: No carotid bruit or JVD.     Trachea: No tracheal deviation.  Cardiovascular:     Rate and Rhythm: Normal rate and regular rhythm.     Heart sounds: Normal heart sounds. No murmur. No friction rub. No gallop.   Pulmonary:     Effort: Pulmonary effort is normal. No respiratory distress.     Breath sounds: Normal breath sounds. No wheezing or rales.  Chest:     Chest wall: No tenderness.  Abdominal:     Tenderness: There is no abdominal tenderness.  Musculoskeletal: Normal range of motion.  Lymphadenopathy:     Cervical: No cervical adenopathy.  Skin:    General: Skin is warm and dry.  Neurological:     Mental Status: She is alert and oriented to person, place, and time.     Cranial Nerves: No cranial nerve deficit.   Psychiatric:        Behavior: Behavior normal.        Thought Content: Thought content normal.        Judgment: Judgment normal.   Assessment/Plan: 1. Benign essential hypertension Stable. Continue bp medication as prescribed   2. Type 2 diabetes mellitus without complication, without long-term current use of insulin (HCC) Continue vicoza as prescribed.   3. Moderate obesity May continue phentermine 37.5mg  tablets daily. Limit calorie intake to 1200*1500 calories per day. Incorporate exercise into daily routine.  - phentermine (ADIPEX-P) 37.5 MG  tablet; Take 1 tablet (37.5 mg total) by mouth daily before breakfast.  Dispense: 30 tablet; Refill: 1  General Counseling: Aram BeechamCynthia verbalizes understanding of the findings of todays visit and agrees with plan of treatment. I have discussed any further diagnostic evaluation that may be needed or ordered today. We also reviewed her medications today. she has been encouraged to call the office with any questions or concerns that should arise related to todays visit.   There is a liability release in patients' chart. There has been a 10 minute discussion about the side effects including but not limited to elevated blood pressure, anxiety, lack of sleep and dry mouth. Pt understands and will like to start/continue on appetite suppressant at this time. There will be one month RX given at the time of visit with proper follow up. Nova diet plan with restricted calories is given to the pt. Pt understands and agrees with  plan of treatment  This patient was seen by Vincent GrosHeather Arabelle Bollig FNP Collaboration with Dr Lyndon CodeFozia M Khan as a part of collaborative care agreement  Meds ordered this encounter  Medications  . phentermine (ADIPEX-P) 37.5 MG tablet    Sig: Take 1 tablet (37.5 mg total) by mouth daily before breakfast.    Dispense:  30 tablet    Refill:  1    Order Specific Question:   Supervising Provider    Answer:   Lyndon CodeKHAN, FOZIA M [[id number]]    Time spent:  5715 Minutes      Dr Lyndon CodeFozia M Khan Internal medicine

## 2019-04-08 NOTE — Progress Notes (Signed)
Pt blood pressure elevated, pt stated she have white coat syndrome, informed provider

## 2019-04-12 DIAGNOSIS — E119 Type 2 diabetes mellitus without complications: Secondary | ICD-10-CM | POA: Insufficient documentation

## 2019-04-12 DIAGNOSIS — E1165 Type 2 diabetes mellitus with hyperglycemia: Secondary | ICD-10-CM | POA: Insufficient documentation

## 2019-05-23 ENCOUNTER — Ambulatory Visit: Payer: BC Managed Care – PPO | Admitting: Nurse Practitioner

## 2019-05-28 ENCOUNTER — Other Ambulatory Visit: Payer: Self-pay

## 2019-05-28 MED ORDER — FLUTICASONE-SALMETEROL 250-50 MCG/DOSE IN AEPB: INHALATION_SPRAY | RESPIRATORY_TRACT | 5 refills | Status: DC

## 2019-06-11 ENCOUNTER — Other Ambulatory Visit: Payer: Self-pay | Admitting: Internal Medicine

## 2019-06-11 DIAGNOSIS — R062 Wheezing: Secondary | ICD-10-CM

## 2019-06-17 ENCOUNTER — Encounter: Payer: Self-pay | Admitting: Adult Health

## 2019-06-17 ENCOUNTER — Ambulatory Visit: Payer: BC Managed Care – PPO | Admitting: Adult Health

## 2019-06-17 ENCOUNTER — Other Ambulatory Visit: Payer: Self-pay

## 2019-06-17 VITALS — BP 126/89 | HR 85 | Resp 16 | Ht 69.0 in | Wt 217.0 lb

## 2019-06-17 DIAGNOSIS — E1165 Type 2 diabetes mellitus with hyperglycemia: Secondary | ICD-10-CM | POA: Diagnosis not present

## 2019-06-17 DIAGNOSIS — E668 Other obesity: Secondary | ICD-10-CM

## 2019-06-17 DIAGNOSIS — I10 Essential (primary) hypertension: Secondary | ICD-10-CM | POA: Diagnosis not present

## 2019-06-17 LAB — POCT GLYCOSYLATED HEMOGLOBIN (HGB A1C): Hemoglobin A1C: 6.5 % — AB (ref 4.0–5.6)

## 2019-06-17 NOTE — Progress Notes (Signed)
[facility] [address] [place], [place] [address]  Internal MEDICINE  Office Visit Note  Patient Name: Jordan Mccoy  [id number]  [id number]  Date of Service: 06/17/2019  Chief Complaint  Patient presents with  . Medical Management of Chronic Issues    6 week follow up   . Diabetes  . Hypertension    HPI Pt is here for follow up on dm, and htn. He A1C today is 6.5, she is doing well.  She is watching her diet, and attempting to exercise. Her blood pressure is good, and she denies any chest pain, sob, or palpitations.     Current Medication: Outpatient Encounter Medications as of 06/17/2019  Medication Sig  . bisoprolol-hydrochlorothiazide (ZIAC) 10-6.25 MG tablet Take 1 tablet by mouth daily.  . Fluticasone-Salmeterol (ADVAIR DISKUS) 250-50 MCG/DOSE AEPB INHALE 1 PUFF(S) TWICE A DAY FOR ASTHMA  . hydrochlorothiazide (HYDRODIURIL) 12.5 MG tablet Take 1 tablet (12.5 mg total) by mouth daily.  . Insulin Pen Needle (BD PEN NEEDLE NANO U/F) 32G X 4 MM MISC Use as directed with victoza  . VENTOLIN HFA 108 (90 Base) MCG/ACT inhaler TAKE 2 PUFFS BY MOUTH EVERY 6 HOURS AS NEEDED FOR WHEEZE OR SHORTNESS OF BREATH  . VICTOZA 18 MG/3ML SOPN INJECT 1.2 MG UNDER THE SKIN ONCE DAILY  . [DISCONTINUED] phentermine (ADIPEX-P) 37.5 MG tablet Take 1 tablet (37.5 mg total) by mouth daily before breakfast. (Patient not taking: Reported on 06/17/2019)   No facility-administered encounter medications on file as of 06/17/2019.     Surgical History: Past Surgical History:  Procedure Laterality Date  . ABDOMINAL HYSTERECTOMY    . CHOLECYSTECTOMY    . right foot surgery      Medical History: Past Medical History:  Diagnosis Date  . Hypertension     Family History: Family History  Problem Relation Age of Onset  . Breast cancer Mother 25  . Breast cancer Maternal Aunt 26    Social History   Socioeconomic History  . Marital status: Single    Spouse name: Not on file  . Number  of children: Not on file  . Years of education: Not on file  . Highest education level: Not on file  Occupational History  . Not on file  Social Needs  . Financial resource strain: Not on file  . Food insecurity    Worry: Not on file    Inability: Not on file  . Transportation needs    Medical: Not on file    Non-medical: Not on file  Tobacco Use  . Smoking status: Never Smoker  . Smokeless tobacco: Never Used  Substance and Sexual Activity  . Alcohol use: Yes    Frequency: Never    Comment: social  . Drug use: No  . Sexual activity: Not on file  Lifestyle  . Physical activity    Days per week: Not on file    Minutes per session: Not on file  . Stress: Not on file  Relationships  . Social Musician on phone: Not on file    Gets together: Not on file    Attends religious service: Not on file    Active member of club or organization: Not on file    Attends meetings of clubs or organizations: Not on file    Relationship status: Not on file  . Intimate partner violence    Fear of current or ex partner: Not on file    Emotionally abused: Not on  file    Physically abused: Not on file    Forced sexual activity: Not on file  Other Topics Concern  . Not on file  Social History Narrative  . Not on file      Review of Systems  Constitutional: Negative for chills, fatigue and unexpected weight change.  HENT: Negative for congestion, rhinorrhea, sneezing and sore throat.   Eyes: Negative for photophobia, pain and redness.  Respiratory: Negative for cough, chest tightness and shortness of breath.   Cardiovascular: Negative for chest pain and palpitations.  Gastrointestinal: Negative for abdominal pain, constipation, diarrhea, nausea and vomiting.  Endocrine: Negative.   Genitourinary: Negative for dysuria and frequency.  Musculoskeletal: Negative for arthralgias, back pain, joint swelling and neck pain.  Skin: Negative for rash.  Allergic/Immunologic: Negative.    Neurological: Negative for tremors and numbness.  Hematological: Negative for adenopathy. Does not bruise/bleed easily.  Psychiatric/Behavioral: Negative for behavioral problems and sleep disturbance. The patient is not nervous/anxious.     Vital Signs: BP 126/89   Pulse 85   Resp 16   Ht 5\' 9"  (1.753 m)   Wt 217 lb (98.4 kg)   SpO2 99%   BMI 32.05 kg/m    Physical Exam Vitals signs and nursing note reviewed.  Constitutional:      General: She is not in acute distress.    Appearance: She is well-developed. She is not diaphoretic.  HENT:     Head: Normocephalic and atraumatic.     Mouth/Throat:     Pharynx: No oropharyngeal exudate.  Eyes:     Pupils: Pupils are equal, round, and reactive to light.  Neck:     Musculoskeletal: Normal range of motion and neck supple.     Thyroid: No thyromegaly.     Vascular: No JVD.     Trachea: No tracheal deviation.  Cardiovascular:     Rate and Rhythm: Normal rate and regular rhythm.     Heart sounds: Normal heart sounds. No murmur. No friction rub. No gallop.   Pulmonary:     Effort: Pulmonary effort is normal. No respiratory distress.     Breath sounds: Normal breath sounds. No wheezing or rales.  Chest:     Chest wall: No tenderness.  Abdominal:     Palpations: Abdomen is soft.     Tenderness: There is no abdominal tenderness. There is no guarding.  Musculoskeletal: Normal range of motion.  Lymphadenopathy:     Cervical: No cervical adenopathy.  Skin:    General: Skin is warm and dry.  Neurological:     Mental Status: She is alert and oriented to person, place, and time.     Cranial Nerves: No cranial nerve deficit.  Psychiatric:        Behavior: Behavior normal.        Thought Content: Thought content normal.        Judgment: Judgment normal.    Assessment/Plan: 1. Uncontrolled type 2 diabetes mellitus with hyperglycemia (HCC) A1c continues to improve now down to 6.5.  Continue present management. - Microalbumin,  urine - POCT HgB A1C  2. Benign essential hypertension Stable continue present therapy.  3. Moderate obesity Obesity Counseling: Risk Assessment: An assessment of behavioral risk factors was made today and includes lack of exercise sedentary lifestyle, lack of portion control and poor dietary habits.  Risk Modification Advice: She was counseled on portion control guidelines. Restricting daily caloric intake to. . The detrimental long term effects of obesity on her health and ongoing  poor compliance was also discussed with the patient.    General Counseling: haliey romberg understanding of the findings of todays visit and agrees with plan of treatment. I have discussed any further diagnostic evaluation that may be needed or ordered today. We also reviewed her medications today. she has been encouraged to call the office with any questions or concerns that should arise related to todays visit.    Orders Placed This Encounter  Procedures  . Microalbumin, urine  . POCT HgB A1C    No orders of the defined types were placed in this encounter.   Time spent: 20 Minutes   This patient was seen by Orson Gear AGNP-C in Collaboration with Dr Lavera Guise as a part of collaborative care agreement     Kendell Bane AGNP-C Internal medicine

## 2019-06-18 LAB — MICROALBUMIN, URINE: Microalbumin, Urine: 3.7 ug/mL

## 2019-08-07 ENCOUNTER — Other Ambulatory Visit: Payer: Self-pay

## 2019-08-07 MED ORDER — BD PEN NEEDLE NANO U/F 32G X 4 MM MISC: 3 refills | Status: DC

## 2019-08-14 ENCOUNTER — Ambulatory Visit: Payer: BC Managed Care – PPO | Admitting: Nurse Practitioner

## 2019-08-15 ENCOUNTER — Other Ambulatory Visit: Payer: Self-pay

## 2019-08-15 ENCOUNTER — Ambulatory Visit: Payer: BC Managed Care – PPO | Admitting: Nurse Practitioner

## 2019-08-15 ENCOUNTER — Encounter: Payer: Self-pay | Admitting: Nurse Practitioner

## 2019-08-15 VITALS — BP 137/81 | HR 76 | Temp 97.7°F | Resp 16 | Ht 69.0 in | Wt 217.8 lb

## 2019-08-15 DIAGNOSIS — I10 Essential (primary) hypertension: Secondary | ICD-10-CM | POA: Diagnosis not present

## 2019-08-15 DIAGNOSIS — E119 Type 2 diabetes mellitus without complications: Secondary | ICD-10-CM | POA: Diagnosis not present

## 2019-08-15 DIAGNOSIS — Z6832 Body mass index (BMI) 32.0-32.9, adult: Secondary | ICD-10-CM

## 2019-08-15 MED ORDER — GLUCOSE BLOOD VI STRP: ORAL_STRIP | 12 refills | Status: DC

## 2019-08-15 MED ORDER — ONETOUCH ULTRASOFT LANCETS MISC: 12 refills | Status: AC

## 2019-08-15 MED ORDER — PHENTERMINE HCL 37.5 MG PO TABS: 37.5000 mg | ORAL_TABLET | Freq: Every day | ORAL | 1 refills | Status: DC

## 2019-08-15 NOTE — Progress Notes (Signed)
[place], [place] [address]  Internal MEDICINE  Office Visit Note  Patient Name: Jordan Mccoy  [id number]  [id number]  Date of Service: 08/31/2019  Chief Complaint  Patient presents with  . Hypertension  . Diabetes  . Quality Metric Gaps    diabetic eye exam     Jordan Mccoy presents for routine follow-up. Her last Hgb A1C, 06/17/2019,  was 6.5, and she states that she has been watching her diet. She states that she has not been checking her blood sugars at home.   Her blood pressure is well managed. Her weight has remained stab.e over the past few months.       Current Medication: Outpatient Encounter Medications as of 08/15/2019  Medication Sig  . BD PEN NEEDLE NANO U/F 32G X 4 MM MISC Use as directed with victoza  . bisoprolol-hydrochlorothiazide (ZIAC) 10-6.25 MG tablet Take 1 tablet by mouth daily.  . Fluticasone-Salmeterol (ADVAIR DISKUS) 250-50 MCG/DOSE AEPB INHALE 1 PUFF(S) TWICE A DAY FOR ASTHMA  . hydrochlorothiazide (HYDRODIURIL) 12.5 MG tablet Take 1 tablet (12.5 mg total) by mouth daily.  . VENTOLIN HFA 108 (90 Base) MCG/ACT inhaler TAKE 2 PUFFS BY MOUTH EVERY 6 HOURS AS NEEDED FOR WHEEZE OR SHORTNESS OF BREATH  . [DISCONTINUED] VICTOZA 18 MG/3ML SOPN INJECT 1.2 MG UNDER THE SKIN ONCE DAILY  . glucose blood test strip Blood sugar testing done once daily - E11.65  . Lancets (ONETOUCH ULTRASOFT) lancets Blood sugar testing QAM - E11.65  . phentermine (ADIPEX-P) 37.5 MG tablet Take 1 tablet (37.5 mg total) by mouth daily before breakfast.   No facility-administered encounter medications on file as of 08/15/2019.     Surgical History: Past Surgical History:  Procedure Laterality Date  . ABDOMINAL HYSTERECTOMY    . CHOLECYSTECTOMY    . right foot surgery      Medical History: Past Medical History:  Diagnosis Date  . Hypertension     Family History: Family History  Problem Relation Age of Onset  . Breast cancer Mother 26   . Breast cancer Maternal Aunt 63    Social History   Socioeconomic History  . Marital status: Single    Spouse name: Not on file  . Number of children: Not on file  . Years of education: Not on file  . Highest education level: Not on file  Occupational History  . Not on file  Social Needs  . Financial resource strain: Not on file  . Food insecurity    Worry: Not on file    Inability: Not on file  . Transportation needs    Medical: Not on file    Non-medical: Not on file  Tobacco Use  . Smoking status: Never Smoker  . Smokeless tobacco: Never Used  Substance and Sexual Activity  . Alcohol use: Yes    Frequency: Never    Comment: social  . Drug use: No  . Sexual activity: Not on file  Lifestyle  . Physical activity    Days per week: Not on file    Minutes per session: Not on file  . Stress: Not on file  Relationships  . Social Herbalist on phone: Not on file    Gets together: Not on file    Attends religious service: Not on file    Active member of club or organization: Not on file    Attends meetings of clubs or organizations: Not on file    Relationship status:  Not on file  . Intimate partner violence    Fear of current or ex partner: Not on file    Emotionally abused: Not on file    Physically abused: Not on file    Forced sexual activity: Not on file  Other Topics Concern  . Not on file  Social History Narrative  . Not on file      Review of Systems  Constitutional: Negative for chills, fatigue and unexpected weight change.  HENT: Negative for congestion, postnasal drip, rhinorrhea, sneezing and sore throat.   Respiratory: Negative for cough, chest tightness, shortness of breath and wheezing.   Cardiovascular: Negative for chest pain and palpitations.  Gastrointestinal: Negative for abdominal pain, constipation, diarrhea, nausea and vomiting.  Musculoskeletal: Negative for arthralgias, back pain, joint swelling, neck pain and neck  stiffness.  Skin: Negative for rash.  Neurological: Negative for dizziness, tremors, numbness and headaches.  Hematological: Negative for adenopathy. Does not bruise/bleed easily.  Psychiatric/Behavioral: Negative for behavioral problems (Depression), sleep disturbance and suicidal ideas. The patient is not nervous/anxious.     Today's Vitals   08/15/19 1431  BP: 137/81  Pulse: 76  Resp: 16  Temp: 97.7 F (36.5 C)  SpO2: 100%  Weight: 217 lb 12.8 oz (98.8 kg)  Height: 5\' 9"  (1.753 m)   Body mass index is 32.16 kg/m.  Physical Exam Vitals signs and nursing note reviewed.  Constitutional:      Appearance: Normal appearance.  Neck:     Musculoskeletal: Normal range of motion and neck supple.  Cardiovascular:     Rate and Rhythm: Normal rate and regular rhythm.     Pulses: Normal pulses.     Heart sounds: Normal heart sounds.  Pulmonary:     Effort: Pulmonary effort is normal.     Breath sounds: Normal breath sounds.  Abdominal:     Palpations: Abdomen is soft.  Musculoskeletal: Normal range of motion.  Skin:    General: Skin is warm and dry.     Capillary Refill: Capillary refill takes less than 2 seconds.  Neurological:     Mental Status: She is alert and oriented to person, place, and time.  Psychiatric:        Mood and Affect: Mood normal.        Behavior: Behavior normal.        Thought Content: Thought content normal.        Judgment: Judgment normal.    Assessment/Plan: 1. Diabetes mellitus without complication (HCC) Most recent HgbA1c 6.5. continue victoza as prescribed. Advised the patient to check sugars every morning. The goal is to have fasting blood sugars below 120.  - glucose blood test strip; Blood sugar testing done once daily - E11.65  Dispense: 100 each; Refill: 12 - Lancets (ONETOUCH ULTRASOFT) lancets; Blood sugar testing QAM - E11.65  Dispense: 100 each; Refill: 12  2. Benign essential hypertension Stable. Continue bp medication as prescribed    3. BMI 32.0-32.9,adult Restart phentermine 37.5mg  tablets daily. Limit calorie intake to 1200-1500 calories per day. Incorporate exercise into daily routine.  - phentermine (ADIPEX-P) 37.5 MG tablet; Take 1 tablet (37.5 mg total) by mouth daily before breakfast.  Dispense: 30 tablet; Refill: 1  General Counseling: Jordan Mccoy verbalizes understanding of the findings of todays visit and agrees with plan of treatment. I have discussed any further diagnostic evaluation that may be needed or ordered today. We also reviewed her medications today. she has been encouraged to call the office with any questions or  concerns that should arise related to todays visit.   There is a liability release in patients' chart. There has been a 10 minute discussion about the side effects including but not limited to elevated blood pressure, anxiety, lack of sleep and dry mouth. Pt understands and will like to start/continue on appetite suppressant at this time. There will be one month RX given at the time of visit with proper follow up. Nova diet plan with restricted calories is given to the pt. Pt understands and agrees with  plan of treatment  This patient was seen by Vincent Gros FNP Collaboration with Dr Lyndon Code as a part of collaborative care agreement  Meds ordered this encounter  Medications  . phentermine (ADIPEX-P) 37.5 MG tablet    Sig: Take 1 tablet (37.5 mg total) by mouth daily before breakfast.    Dispense:  30 tablet    Refill:  1    Order Specific Question:   Supervising Provider    Answer:   Lyndon Code [[id number]]  . glucose blood test strip    Sig: Blood sugar testing done once daily - E11.65    Dispense:  100 each    Refill:  12    Order Specific Question:   Supervising Provider    Answer:   Lyndon Code [[id number]]  . Lancets (ONETOUCH ULTRASOFT) lancets    Sig: Blood sugar testing QAM - E11.65    Dispense:  100 each    Refill:  12    Order Specific Question:   Supervising Provider     Answer:   Lyndon Code [[id number]]    Time spent: 27 Minutes      Dr Lyndon Code Internal medicine

## 2019-08-18 ENCOUNTER — Other Ambulatory Visit: Payer: Self-pay | Admitting: Adult Health

## 2019-08-18 ENCOUNTER — Other Ambulatory Visit: Payer: Self-pay

## 2019-08-18 DIAGNOSIS — E1165 Type 2 diabetes mellitus with hyperglycemia: Secondary | ICD-10-CM

## 2019-08-18 MED ORDER — ONETOUCH VERIO VI STRP: ORAL_STRIP | 5 refills | Status: DC

## 2019-08-18 MED ORDER — VICTOZA 18 MG/3ML ~~LOC~~ SOPN: PEN_INJECTOR | SUBCUTANEOUS | 4 refills | Status: DC

## 2019-08-20 ENCOUNTER — Other Ambulatory Visit: Payer: Self-pay | Admitting: Nurse Practitioner

## 2019-08-20 DIAGNOSIS — E1165 Type 2 diabetes mellitus with hyperglycemia: Secondary | ICD-10-CM

## 2019-08-20 MED ORDER — VICTOZA 18 MG/3ML ~~LOC~~ SOPN: PEN_INJECTOR | SUBCUTANEOUS | 5 refills | Status: DC

## 2019-08-31 DIAGNOSIS — Z6832 Body mass index (BMI) 32.0-32.9, adult: Secondary | ICD-10-CM | POA: Insufficient documentation

## 2019-10-15 ENCOUNTER — Other Ambulatory Visit: Payer: Self-pay

## 2019-10-15 MED ORDER — BISOPROLOL-HYDROCHLOROTHIAZIDE 10-6.25 MG PO TABS: 1.0000 | ORAL_TABLET | Freq: Every day | ORAL | 6 refills | Status: DC

## 2019-10-16 ENCOUNTER — Telehealth: Payer: Self-pay

## 2019-10-16 NOTE — Telephone Encounter (Signed)
CONFIRMED AND SCREENED FOR 10-20-19 OV. 

## 2019-10-20 ENCOUNTER — Encounter: Payer: Self-pay | Admitting: Nurse Practitioner

## 2019-10-20 ENCOUNTER — Other Ambulatory Visit: Payer: Self-pay

## 2019-10-20 ENCOUNTER — Ambulatory Visit: Payer: BC Managed Care – PPO | Admitting: Nurse Practitioner

## 2019-10-20 VITALS — BP 142/68 | HR 77 | Resp 16 | Ht 69.0 in | Wt 214.0 lb

## 2019-10-20 DIAGNOSIS — Z6832 Body mass index (BMI) 32.0-32.9, adult: Secondary | ICD-10-CM | POA: Diagnosis not present

## 2019-10-20 DIAGNOSIS — E1165 Type 2 diabetes mellitus with hyperglycemia: Secondary | ICD-10-CM

## 2019-10-20 DIAGNOSIS — Z1231 Encounter for screening mammogram for malignant neoplasm of breast: Secondary | ICD-10-CM

## 2019-10-20 DIAGNOSIS — I10 Essential (primary) hypertension: Secondary | ICD-10-CM | POA: Diagnosis not present

## 2019-10-20 LAB — POCT GLYCOSYLATED HEMOGLOBIN (HGB A1C): Hemoglobin A1C: 6.2 % — AB (ref 4.0–5.6)

## 2019-10-20 MED ORDER — PHENTERMINE HCL 37.5 MG PO TABS: 37.5000 mg | ORAL_TABLET | Freq: Every day | ORAL | 1 refills | Status: DC

## 2019-10-20 NOTE — Progress Notes (Signed)
[facility] [address] [place], [place] [address]  Internal MEDICINE  Office Visit Note  Patient Name: Jordan Mccoy  [id number]  [id number]  Date of Service: 10/22/2019  Chief Complaint  Patient presents with  . Follow-up    weight management   . Diabetes    The patient is here for routine follow up visit. She states that she is doing well. Blood sugars well controlled. Currently taking victoza 1.2mg  daily. Her HgbA1c is 6.2 today. Blood pressure is well controlled. She is taking phentermine to help with weight management. She has lost 3 pounds since her last visit. She has done well with medication and has no concerns or complaints. She is due to have a screening mammogram.       Current Medication: Outpatient Encounter Medications as of 10/20/2019  Medication Sig  . BD PEN NEEDLE NANO U/F 32G X 4 MM MISC Use as directed with victoza  . bisoprolol-hydrochlorothiazide (ZIAC) 10-6.25 MG tablet Take 1 tablet by mouth daily.  . Fluticasone-Salmeterol (ADVAIR DISKUS) 250-50 MCG/DOSE AEPB INHALE 1 PUFF(S) TWICE A DAY FOR ASTHMA  . glucose blood (ONETOUCH VERIO) test strip Use as directed once a daily diag e11.65  . glucose blood test strip Blood sugar testing done once daily - E11.65  . hydrochlorothiazide (HYDRODIURIL) 12.5 MG tablet Take 1 tablet (12.5 mg total) by mouth daily.  . Lancets (ONETOUCH ULTRASOFT) lancets Blood sugar testing QAM - E11.65  . liraglutide (VICTOZA) 18 MG/3ML SOPN INJECT 1.2 MG UNDER THE SKIN ONCE DAILY  . phentermine (ADIPEX-P) 37.5 MG tablet Take 1 tablet (37.5 mg total) by mouth daily before breakfast.  . VENTOLIN HFA 108 (90 Base) MCG/ACT inhaler TAKE 2 PUFFS BY MOUTH EVERY 6 HOURS AS NEEDED FOR WHEEZE OR SHORTNESS OF BREATH  . [DISCONTINUED] phentermine (ADIPEX-P) 37.5 MG tablet Take 1 tablet (37.5 mg total) by mouth daily before breakfast.   No facility-administered encounter medications on file as of 10/20/2019.    Surgical  History: Past Surgical History:  Procedure Laterality Date  . ABDOMINAL HYSTERECTOMY    . CHOLECYSTECTOMY    . right foot surgery      Medical History: Past Medical History:  Diagnosis Date  . Hypertension     Family History: Family History  Problem Relation Age of Onset  . Breast cancer Mother 56  . Breast cancer Maternal Aunt 5    Social History   Socioeconomic History  . Marital status: Single    Spouse name: Not on file  . Number of children: Not on file  . Years of education: Not on file  . Highest education level: Not on file  Occupational History  . Not on file  Tobacco Use  . Smoking status: Never Smoker  . Smokeless tobacco: Never Used  Substance and Sexual Activity  . Alcohol use: Yes    Comment: social  . Drug use: No  . Sexual activity: Not on file  Other Topics Concern  . Not on file  Social History Narrative  . Not on file   Social Determinants of Health   Financial Resource Strain:   . Difficulty of Paying Living Expenses: Not on file  Food Insecurity:   . Worried About Programme researcher, broadcasting/film/video in the Last Year: Not on file  . Ran Out of Food in the Last Year: Not on file  Transportation Needs:   . Lack of Transportation (Medical): Not on file  . Lack of Transportation (Non-Medical): Not on file  Physical Activity:   .  Days of Exercise per Week: Not on file  . Minutes of Exercise per Session: Not on file  Stress:   . Feeling of Stress : Not on file  Social Connections:   . Frequency of Communication with Friends and Family: Not on file  . Frequency of Social Gatherings with Friends and Family: Not on file  . Attends Religious Services: Not on file  . Active Member of Clubs or Organizations: Not on file  . Attends Banker Meetings: Not on file  . Marital Status: Not on file  Intimate Partner Violence:   . Fear of Current or Ex-Partner: Not on file  . Emotionally Abused: Not on file  . Physically Abused: Not on file  .  Sexually Abused: Not on file      Review of Systems  Constitutional: Negative for chills, fatigue and unexpected weight change.       Three pound weight loss since her last visit .  HENT: Negative for congestion, postnasal drip, rhinorrhea, sneezing and sore throat.   Respiratory: Negative for cough, chest tightness, shortness of breath and wheezing.   Cardiovascular: Negative for chest pain and palpitations.  Gastrointestinal: Negative for abdominal pain, constipation, diarrhea, nausea and vomiting.  Endocrine: Negative for cold intolerance, heat intolerance, polydipsia and polyuria.       Blood sugars doing well   Musculoskeletal: Negative for arthralgias, back pain, joint swelling, neck pain and neck stiffness.  Skin: Negative for rash.  Neurological: Negative for dizziness, tremors, numbness and headaches.  Hematological: Negative for adenopathy. Does not bruise/bleed easily.  Psychiatric/Behavioral: Negative for behavioral problems (Depression), sleep disturbance and suicidal ideas. The patient is not nervous/anxious.     Today's Vitals   10/20/19 1617  BP: (!) 142/68  Pulse: 77  Resp: 16  SpO2: 99%  Weight: 214 lb (97.1 kg)  Height: 5\' 9"  (1.753 m)   Body mass index is 31.6 kg/m.  Physical Exam Vitals and nursing note reviewed.  Constitutional:      General: She is not in acute distress.    Appearance: Normal appearance. She is well-developed. She is not diaphoretic.  HENT:     Head: Normocephalic and atraumatic.     Nose: Nose normal.     Mouth/Throat:     Pharynx: No oropharyngeal exudate.  Eyes:     Pupils: Pupils are equal, round, and reactive to light.  Neck:     Thyroid: No thyromegaly.     Vascular: No JVD.     Trachea: No tracheal deviation.  Cardiovascular:     Rate and Rhythm: Normal rate and regular rhythm.     Heart sounds: Normal heart sounds. No murmur. No friction rub. No gallop.   Pulmonary:     Effort: Pulmonary effort is normal. No  respiratory distress.     Breath sounds: Normal breath sounds. No wheezing or rales.  Chest:     Chest wall: No tenderness.  Abdominal:     Palpations: Abdomen is soft.  Musculoskeletal:        General: Normal range of motion.     Cervical back: Normal range of motion and neck supple.  Lymphadenopathy:     Cervical: No cervical adenopathy.  Skin:    General: Skin is warm and dry.  Neurological:     Mental Status: She is alert and oriented to person, place, and time.     Cranial Nerves: No cranial nerve deficit.  Psychiatric:        Behavior: Behavior normal.  Thought Content: Thought content normal.        Judgment: Judgment normal.    Assessment/Plan: 1. Type 2 diabetes mellitus with hyperglycemia, without long-term current use of insulin (HCC) - POCT HgB A1C 6.2 today. Continue victoza as prescribed   2. Benign essential hypertension Stable. Continue bp medication as prescribed   3. BMI 32.0-32.9,adult Improving. Continue phentermine as prescribed. Limit calorie intake to 1200-1500 calories per day. Continue to incorporate exercise into daily routine.  - phentermine (ADIPEX-P) 37.5 MG tablet; Take 1 tablet (37.5 mg total) by mouth daily before breakfast.  Dispense: 30 tablet; Refill: 1  4. Encounter for screening mammogram for malignant neoplasm of breast - scrrening mammo; Future  General Counseling: lorna strother understanding of the findings of todays visit and agrees with plan of treatment. I have discussed any further diagnostic evaluation that may be needed or ordered today. We also reviewed her medications today. she has been encouraged to call the office with any questions or concerns that should arise related to todays visit.   There is a liability release in patients' chart. There has been a 10 minute discussion about the side effects including but not limited to elevated blood pressure, anxiety, lack of sleep and dry mouth. Pt understands and will like  to start/continue on appetite suppressant at this time. There will be one month RX given at the time of visit with proper follow up. Nova diet plan with restricted calories is given to the pt. Pt understands and agrees with  plan of treatment  This patient was seen by Leretha Pol FNP Collaboration with Dr Lavera Guise as a part of collaborative care agreement  Orders Placed This Encounter  Procedures  . scrrening mammo  . POCT HgB A1C    Meds ordered this encounter  Medications  . phentermine (ADIPEX-P) 37.5 MG tablet    Sig: Take 1 tablet (37.5 mg total) by mouth daily before breakfast.    Dispense:  30 tablet    Refill:  1    Order Specific Question:   Supervising Provider    Answer:   Lavera Guise [[id number]]    Total time spent: 30 Minutes  Time spent includes review of chart, medications, test results, and follow up plan with the patient.      Dr Lavera Guise Internal medicine

## 2019-10-22 DIAGNOSIS — Z1231 Encounter for screening mammogram for malignant neoplasm of breast: Secondary | ICD-10-CM | POA: Insufficient documentation

## 2019-11-13 ENCOUNTER — Ambulatory Visit
Admission: RE | Admit: 2019-11-13 | Discharge: 2019-11-13 | Disposition: A | Discharge status: Home / Self Care | Payer: BC Managed Care – PPO | Source: Ambulatory Visit | Attending: Nurse Practitioner | Admitting: Nurse Practitioner

## 2019-11-13 DIAGNOSIS — Z1231 Encounter for screening mammogram for malignant neoplasm of breast: Secondary | ICD-10-CM | POA: Insufficient documentation

## 2019-11-14 NOTE — Progress Notes (Signed)
Negative mammogram

## 2019-11-23 ENCOUNTER — Other Ambulatory Visit: Payer: Self-pay | Admitting: Adult Health

## 2019-11-23 DIAGNOSIS — R062 Wheezing: Secondary | ICD-10-CM

## 2019-12-05 ENCOUNTER — Ambulatory Visit: Payer: BC Managed Care – PPO | Admitting: Nurse Practitioner

## 2020-01-29 ENCOUNTER — Telehealth: Payer: Self-pay

## 2020-01-29 NOTE — Telephone Encounter (Signed)
Confirmed and screened for 02-02-20 ov. 

## 2020-02-02 ENCOUNTER — Encounter: Payer: Self-pay | Admitting: Nurse Practitioner

## 2020-02-02 ENCOUNTER — Other Ambulatory Visit: Payer: Self-pay

## 2020-02-02 ENCOUNTER — Ambulatory Visit: Payer: BC Managed Care – PPO | Admitting: Nurse Practitioner

## 2020-02-02 VITALS — BP 144/71 | HR 74 | Temp 97.8°F | Resp 16 | Ht 69.0 in | Wt 217.8 lb

## 2020-02-02 DIAGNOSIS — Z6832 Body mass index (BMI) 32.0-32.9, adult: Secondary | ICD-10-CM

## 2020-02-02 DIAGNOSIS — R062 Wheezing: Secondary | ICD-10-CM | POA: Diagnosis not present

## 2020-02-02 DIAGNOSIS — E1165 Type 2 diabetes mellitus with hyperglycemia: Secondary | ICD-10-CM

## 2020-02-02 DIAGNOSIS — I10 Essential (primary) hypertension: Secondary | ICD-10-CM

## 2020-02-02 DIAGNOSIS — R479 Unspecified speech disturbances: Secondary | ICD-10-CM

## 2020-02-02 LAB — POCT GLYCOSYLATED HEMOGLOBIN (HGB A1C): Hemoglobin A1C: 6.2 % — AB (ref 4.0–5.6)

## 2020-02-02 MED ORDER — ALBUTEROL SULFATE HFA 108 (90 BASE) MCG/ACT IN AERS: 2.0000 | INHALATION_SPRAY | Freq: Four times a day (QID) | RESPIRATORY_TRACT | 3 refills | Status: DC | PRN

## 2020-02-02 MED ORDER — PHENTERMINE HCL 37.5 MG PO TABS: 37.5000 mg | ORAL_TABLET | Freq: Every day | ORAL | 1 refills | Status: DC

## 2020-02-02 NOTE — Progress Notes (Signed)
[facility] [address] [place], [place] [address]  Internal MEDICINE  Office Visit Note  Patient Name: Jordan Mccoy  [id number]  [id number]  Date of Service: 02/17/2020  Chief Complaint  Patient presents with  . Follow-up    weight management  . Diabetes    The patient is here for routine follow up. She is concerned about a three pound weight gain since her last visit. She has taken phentermine in the past and has done well. Her blood pressure is well managed. Blood sugars remain well controlled. Her HgbA1c is 6.2 today.  She states that she has history of esophageal strictures which were present for several years when she was little. She had to have endoscopies several times. She feels as though she has long lasting speech issues as a result. She would like to see speech pathologist to try to help her overcome this speech impediment.      Current Medication: Outpatient Encounter Medications as of 02/02/2020  Medication Sig  . albuterol (VENTOLIN HFA) 108 (90 Base) MCG/ACT inhaler Inhale 2 puffs into the lungs every 6 (six) hours as needed for wheezing or shortness of breath.  . BD PEN NEEDLE NANO U/F 32G X 4 MM MISC Use as directed with victoza  . bisoprolol-hydrochlorothiazide (ZIAC) 10-6.25 MG tablet Take 1 tablet by mouth daily.  . Fluticasone-Salmeterol (ADVAIR DISKUS) 250-50 MCG/DOSE AEPB INHALE 1 PUFF(S) TWICE A DAY FOR ASTHMA  . glucose blood (ONETOUCH VERIO) test strip Use as directed once a daily diag e11.65  . glucose blood test strip Blood sugar testing done once daily - E11.65  . hydrochlorothiazide (HYDRODIURIL) 12.5 MG tablet Take 1 tablet (12.5 mg total) by mouth daily.  . Lancets (ONETOUCH ULTRASOFT) lancets Blood sugar testing QAM - E11.65  . liraglutide (VICTOZA) 18 MG/3ML SOPN INJECT 1.2 MG UNDER THE SKIN ONCE DAILY  . phentermine (ADIPEX-P) 37.5 MG tablet Take 1 tablet (37.5 mg total) by mouth daily before breakfast.  . [DISCONTINUED]  phentermine (ADIPEX-P) 37.5 MG tablet Take 1 tablet (37.5 mg total) by mouth daily before breakfast.  . [DISCONTINUED] VENTOLIN HFA 108 (90 Base) MCG/ACT inhaler TAKE 2 PUFFS BY MOUTH EVERY 6 HOURS AS NEEDED FOR WHEEZE OR SHORTNESS OF BREATH   No facility-administered encounter medications on file as of 02/02/2020.    Surgical History: Past Surgical History:  Procedure Laterality Date  . ABDOMINAL HYSTERECTOMY    . CHOLECYSTECTOMY    . right foot surgery      Medical History: Past Medical History:  Diagnosis Date  . Hypertension     Family History: Family History  Problem Relation Age of Onset  . Breast cancer Mother 102  . Breast cancer Maternal Aunt 72    Social History   Socioeconomic History  . Marital status: Single    Spouse name: Not on file  . Number of children: Not on file  . Years of education: Not on file  . Highest education level: Not on file  Occupational History  . Not on file  Tobacco Use  . Smoking status: Never Smoker  . Smokeless tobacco: Never Used  Substance and Sexual Activity  . Alcohol use: Yes    Comment: social  . Drug use: No  . Sexual activity: Not on file  Other Topics Concern  . Not on file  Social History Narrative  . Not on file   Social Determinants of Health   Financial Resource Strain:   . Difficulty of Paying Living Expenses:  Food Insecurity:   . Worried About Programme researcher, broadcasting/film/video in the Last Year:   . Barista in the Last Year:   Transportation Needs:   . Freight forwarder (Medical):   Marland Kitchen Lack of Transportation (Non-Medical):   Physical Activity:   . Days of Exercise per Week:   . Minutes of Exercise per Session:   Stress:   . Feeling of Stress :   Social Connections:   . Frequency of Communication with Friends and Family:   . Frequency of Social Gatherings with Friends and Family:   . Attends Religious Services:   . Active Member of Clubs or Organizations:   . Attends Banker Meetings:    Marland Kitchen Marital Status:   Intimate Partner Violence:   . Fear of Current or Ex-Partner:   . Emotionally Abused:   Marland Kitchen Physically Abused:   . Sexually Abused:       Review of Systems  Constitutional: Negative for chills, fatigue and unexpected weight change.       Three pound weight gain since her last visit.   HENT: Negative for congestion, postnasal drip, rhinorrhea, sneezing and sore throat.   Respiratory: Negative for cough, chest tightness, shortness of breath and wheezing.   Cardiovascular: Negative for chest pain and palpitations.  Gastrointestinal: Negative for abdominal pain, constipation, diarrhea, nausea and vomiting.  Endocrine: Negative for cold intolerance, heat intolerance, polydipsia and polyuria.       Blood sugars doing well   Musculoskeletal: Negative for arthralgias, back pain, joint swelling, neck pain and neck stiffness.  Skin: Negative for rash.  Allergic/Immunologic: Negative for environmental allergies.  Neurological: Negative for dizziness, tremors, numbness and headaches.  Hematological: Negative for adenopathy. Does not bruise/bleed easily.  Psychiatric/Behavioral: Negative for behavioral problems (Depression), sleep disturbance and suicidal ideas. The patient is not nervous/anxious.     Today's Vitals   02/02/20 1604  BP: (!) 144/71  Pulse: 74  Resp: 16  Temp: 97.8 F (36.6 C)  SpO2: 99%  Weight: 217 lb 12.8 oz (98.8 kg)  Height: 5\' 9"  (1.753 m)   Body mass index is 32.16 kg/m.  Physical Exam Vitals and nursing note reviewed.  Constitutional:      General: She is not in acute distress.    Appearance: Normal appearance. She is well-developed. She is not diaphoretic.  HENT:     Head: Normocephalic and atraumatic.     Nose: Nose normal.     Mouth/Throat:     Pharynx: No oropharyngeal exudate.  Eyes:     Pupils: Pupils are equal, round, and reactive to light.  Neck:     Thyroid: No thyromegaly.     Vascular: No JVD.     Trachea: No tracheal  deviation.  Cardiovascular:     Rate and Rhythm: Normal rate and regular rhythm.     Heart sounds: Normal heart sounds. No murmur. No friction rub. No gallop.   Pulmonary:     Effort: Pulmonary effort is normal. No respiratory distress.     Breath sounds: Normal breath sounds. No wheezing or rales.  Chest:     Chest wall: No tenderness.  Abdominal:     Palpations: Abdomen is soft.  Musculoskeletal:        General: Normal range of motion.     Cervical back: Normal range of motion and neck supple.  Lymphadenopathy:     Cervical: No cervical adenopathy.  Skin:    General: Skin is warm and dry.  Neurological:     Mental Status: She is alert and oriented to person, place, and time.     Cranial Nerves: No cranial nerve deficit.  Psychiatric:        Mood and Affect: Mood normal.        Behavior: Behavior normal.        Thought Content: Thought content normal.        Judgment: Judgment normal.    Assessment/Plan: 1. Type 2 diabetes mellitus with hyperglycemia, without long-term current use of insulin (HCC) - POCT HgB A1C 6.2 today. Continue victoza as prescribed. Monitor closely.   2. Benign essential hypertension Stable. Continue to take BP medication as prescribed   3. Difficulty with speech Refer to speech therapy per patient request.  - Ambulatory referral to Speech Therapy  4. Wheezing May use ventolin inhaler as needed and as prescribed. Refills provided today.  - albuterol (VENTOLIN HFA) 108 (90 Base) MCG/ACT inhaler; Inhale 2 puffs into the lungs every 6 (six) hours as needed for wheezing or shortness of breath.  Dispense: 18 g; Refill: 3  5. BMI 32.0-32.9,adult Restart phentermine 37.5mg  daily. Limit calorie intake to 1200 calories per day and incorporate exercise into daily routine.  - phentermine (ADIPEX-P) 37.5 MG tablet; Take 1 tablet (37.5 mg total) by mouth daily before breakfast.  Dispense: 30 tablet; Refill: 1  General Counseling: Amandajo verbalizes  understanding of the findings of todays visit and agrees with plan of treatment. I have discussed any further diagnostic evaluation that may be needed or ordered today. We also reviewed her medications today. she has been encouraged to call the office with any questions or concerns that should arise related to todays visit.   There is a liability release in patients' chart. There has been a 10 minute discussion about the side effects including but not limited to elevated blood pressure, anxiety, lack of sleep and dry mouth. Pt understands and will like to start/continue on appetite suppressant at this time. There will be one month RX given at the time of visit with proper follow up. Nova diet plan with restricted calories is given to the pt. Pt understands and agrees with  plan of treatment  This patient was seen by Leretha Pol FNP Collaboration with Dr Lavera Guise as a part of collaborative care agreement  Orders Placed This Encounter  Procedures  . Ambulatory referral to Speech Therapy  . POCT HgB A1C    Meds ordered this encounter  Medications  . phentermine (ADIPEX-P) 37.5 MG tablet    Sig: Take 1 tablet (37.5 mg total) by mouth daily before breakfast.    Dispense:  30 tablet    Refill:  1    Order Specific Question:   Supervising Provider    Answer:   Lavera Guise [place]  . albuterol (VENTOLIN HFA) 108 (90 Base) MCG/ACT inhaler    Sig: Inhale 2 puffs into the lungs every 6 (six) hours as needed for wheezing or shortness of breath.    Dispense:  18 g    Refill:  3    Order Specific Question:   Supervising Provider    Answer:   Lavera Guise [[id number]]    Total time spent: 30 Minutes   Time spent includes review of chart, medications, test results, and follow up plan with the patient.      Dr Lavera Guise Internal medicine

## 2020-02-17 DIAGNOSIS — R479 Unspecified speech disturbances: Secondary | ICD-10-CM | POA: Insufficient documentation

## 2020-02-17 DIAGNOSIS — R062 Wheezing: Secondary | ICD-10-CM | POA: Insufficient documentation

## 2020-02-20 ENCOUNTER — Ambulatory Visit: Payer: BC Managed Care – PPO | Attending: Nurse Practitioner | Admitting: Speech Pathology

## 2020-02-20 ENCOUNTER — Other Ambulatory Visit: Payer: Self-pay

## 2020-02-20 ENCOUNTER — Other Ambulatory Visit: Payer: Self-pay | Admitting: Nurse Practitioner

## 2020-02-20 ENCOUNTER — Encounter: Payer: Self-pay | Admitting: Speech Pathology

## 2020-02-20 DIAGNOSIS — Z0001 Encounter for general adult medical examination with abnormal findings: Secondary | ICD-10-CM | POA: Diagnosis not present

## 2020-02-20 DIAGNOSIS — I10 Essential (primary) hypertension: Secondary | ICD-10-CM | POA: Diagnosis not present

## 2020-02-20 DIAGNOSIS — E1165 Type 2 diabetes mellitus with hyperglycemia: Secondary | ICD-10-CM | POA: Diagnosis not present

## 2020-02-20 DIAGNOSIS — R471 Dysarthria and anarthria: Secondary | ICD-10-CM | POA: Insufficient documentation

## 2020-02-20 DIAGNOSIS — E559 Vitamin D deficiency, unspecified: Secondary | ICD-10-CM | POA: Diagnosis not present

## 2020-02-20 NOTE — Therapy (Signed)
[facility] MAIN [facility] SERVICES [address] [place], [place], [address] Phone: [phone]   Fax:  [phone]  Speech Language Pathology Evaluation  Patient Details  Name: Yulissa Needham MRN: [id number] Date of Birth: 08/08/1971 Referring Provider (SLP): Vincent Gros   Encounter Date: 02/20/2020  End of Session - 02/20/20 1055    Visit Number  1    Number of Visits  1    Date for SLP Re-Evaluation  02/20/20    Authorization Type  [employer]    Authorization Time Period  02/20/2020    Authorization - Visit Number  1    Authorization - Number of Visits  1    Progress Note Due on Visit  1    SLP Start Time  1005    SLP Stop Time   1045    SLP Time Calculation (min)  40 min    Activity Tolerance  Patient tolerated treatment well       Past Medical History:  Diagnosis Date  . Hypertension     Past Surgical History:  Procedure Laterality Date  . ABDOMINAL HYSTERECTOMY    . CHOLECYSTECTOMY    . right foot surgery      There were no vitals filed for this visit.  Subjective Assessment - 02/20/20 1049    Subjective  pt pleasant, conversant, enjoyable to talk with    Currently in Pain?  No/denies         SLP Evaluation [facility] - 02/20/20 1049      SLP Visit Information   SLP Received On  02/20/20    Referring Provider (SLP)  Vincent Gros    Onset Date  02/17/2020      Subjective   Subjective  pt pleasant, conversant, easy to talk with    Patient/Family Stated Goal  to have her speech evaluated      General Information   HPI  Forrestine Lecrone is a 49 y.o. female who presents today for a speech evaluation. 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 Most recent dilation was in 2021. Pt is not currently under the care of GI. She feels as though she has long lasting speech issues as a result. She would like to see speech pathologist to try to help her overcome this  speech impediment.     Behavioral/Cognition  pleasant, oriented    Mobility Status  ambulatory      Balance Screen   Has the patient fallen in the past 6 months  No    Has the patient had a decrease in activity level because of a fear of falling?   No    Is the patient reluctant to leave their home because of a fear of falling?   No      Prior Functional Status   Cognitive/Linguistic Baseline  Within functional limits    Type of Home  House     Lives With  Spouse    Vocation  Full time employment      Cognition   Overall Cognitive Status  Within Functional Limits for tasks assessed      Auditory Comprehension   Overall Auditory Comprehension  Appears within functional limits for tasks assessed      Visual Recognition/Discrimination   Discrimination  Within Function Limits      Reading Comprehension   Reading Status  Not tested      Expression  Primary Mode of Expression  Verbal      Verbal Expression   Overall Verbal Expression  Appears within functional limits for tasks assessed    Initiation  No impairment    Automatic Speech  Name;Social Response;Day of week;Month of year;Counting    Level of Generative/Spontaneous Verbalization  Conversation    Repetition  No impairment    Naming  No impairment    Pragmatics  No impairment    Non-Verbal Means of Communication  Not applicable      Written Expression   Dominant Hand  Right    Written Expression  Not tested      Oral Motor/Sensory Function   Overall Oral Motor/Sensory Function  Appears within functional limits for tasks assessed      Motor Speech   Overall Motor Speech  Appears within functional limits for tasks assessed    Respiration  Within functional limits    Phonation  Normal    Resonance  Within functional limits    Articulation  Within functional limitis    Intelligibility  Intelligible    Motor Planning  Witnin functional limits    Motor Speech Errors  Not applicable        SLP Education -  02/20/20 1055    Education Details  educaiton provided on different tasks within evaluation    Person(s) Educated  Patient    Methods  Explanation;Demonstration    Comprehension  Verbalized understanding;Returned demonstration           Plan - 02/20/20 1056    Clinical Impression Statement  Pt presents with completely intelligible speech that is free of any speech impairment. Pt's speech intelligibility was assessed during complex conversation about topics unknown to this writter. At no time did pt present with any decreased intelligibility. Pt's oral motor and motor speech abilities were also completely normal. As a result, no further ST services are required.    Speech Therapy Frequency  --   N/A   Duration  --   N/A   Potential to Achieve Goals  --   N/A   Potential Considerations  --   N/A   SLP Home Exercise Plan  N/A    Consulted and Agree with Plan of Care  Patient       Patient will benefit from skilled therapeutic intervention in order to improve the following deficits and impairments:   Dysarthria and anarthria    Problem List Patient Active Problem List   Diagnosis Date Noted  . Difficulty with speech 02/17/2020  . Wheezing 02/17/2020  . Encounter for screening mammogram for malignant neoplasm of breast 10/22/2019  . BMI 32.0-32.9,adult 08/31/2019  . Type 2 diabetes mellitus with hyperglycemia, without long-term current use of insulin ([place]) 04/12/2019  . Uncontrolled type 2 diabetes mellitus with hyperglycemia ([place]) 03/04/2018  . Moderate obesity 03/04/2018  . Congenital stenosis and stricture of esophagus 09/27/2011  . Benign essential hypertension 07/12/2011  . Asthma 07/12/2011  . 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 Stricture of esophagus 07/12/2011   Garlene Apperson B. Rutherford Nail M.S., CCC-SLP, [place] Office [phone]  Stormy Fabian 02/20/2020, 11:00 AM  [place] MAIN [facility] SERVICES [address] [place], [place], [address] Phone: [phone]   Fax:  [phone]  Name: Dorine Duffey MRN: [id number] Date of Birth: 1971-05-11

## 2020-02-21 LAB — T4, FREE: Free T4: 1.31 ng/dL (ref 0.82–1.77)

## 2020-02-21 LAB — COMPREHENSIVE METABOLIC PANEL
ALT: 17 IU/L (ref 0–32)
AST: 17 IU/L (ref 0–40)
Albumin/Globulin Ratio: 1.6 (ref 1.2–2.2)
Albumin: 4.6 g/dL (ref 3.8–4.9)
Alkaline Phosphatase: 64 IU/L (ref 39–117)
BUN/Creatinine Ratio: 17 (ref 9–23)
BUN: 17 mg/dL (ref 6–24)
Bilirubin Total: 0.2 mg/dL (ref 0.0–1.2)
CO2: 25 mmol/L (ref 20–29)
Calcium: 10 mg/dL (ref 8.7–10.2)
Chloride: 100 mmol/L (ref 96–106)
Creatinine, Ser: 0.99 mg/dL (ref 0.57–1.00)
GFR calc Af Amer: 76 mL/min/{1.73_m2} (ref >59)
GFR calc non Af Amer: 66 mL/min/{1.73_m2} (ref >59)
Globulin, Total: 2.9 g/dL (ref 1.5–4.5)
Glucose: 93 mg/dL (ref 65–99)
Potassium: 4.2 mmol/L (ref 3.5–5.2)
Sodium: 139 mmol/L (ref 134–144)
Total Protein: 7.5 g/dL (ref 6.0–8.5)

## 2020-02-21 LAB — CBC
Hematocrit: 40 % (ref 34.0–46.6)
Hemoglobin: 13.5 g/dL (ref 11.1–15.9)
MCH: 26.6 pg (ref 26.6–33.0)
MCHC: 33.8 g/dL (ref 31.5–35.7)
MCV: 79 fL (ref 79–97)
Platelets: 216 10*3/uL (ref 150–450)
RBC: 5.08 x10E6/uL (ref 3.77–5.28)
RDW: 12.9 % (ref 11.7–15.4)
WBC: 10.1 10*3/uL (ref 3.4–10.8)

## 2020-02-21 LAB — VITAMIN D 25 HYDROXY (VIT D DEFICIENCY, FRACTURES): Vit D, 25-Hydroxy: 26.2 ng/mL — ABNORMAL LOW (ref 30.0–100.0)

## 2020-02-21 LAB — LIPID PANEL WITH LDL/HDL RATIO
Cholesterol, Total: 226 mg/dL — ABNORMAL HIGH (ref 100–199)
HDL: 41 mg/dL (ref >39)
LDL Chol Calc (NIH): 147 mg/dL — ABNORMAL HIGH (ref 0–99)
LDL/HDL Ratio: 3.6 ratio — ABNORMAL HIGH (ref 0.0–3.2)
Triglycerides: 209 mg/dL — ABNORMAL HIGH (ref 0–149)
VLDL Cholesterol Cal: 38 mg/dL (ref 5–40)

## 2020-02-21 LAB — TSH: TSH: 1.28 u[IU]/mL (ref 0.450–4.500)

## 2020-02-26 ENCOUNTER — Encounter: Payer: BC Managed Care – PPO | Admitting: Speech Pathology

## 2020-03-01 ENCOUNTER — Encounter: Payer: BC Managed Care – PPO | Admitting: Speech Pathology

## 2020-03-11 ENCOUNTER — Encounter: Payer: BC Managed Care – PPO | Admitting: Speech Pathology

## 2020-03-16 ENCOUNTER — Encounter: Payer: BC Managed Care – PPO | Admitting: Speech Pathology

## 2020-03-18 ENCOUNTER — Other Ambulatory Visit: Payer: Self-pay

## 2020-03-18 ENCOUNTER — Encounter: Payer: Self-pay | Admitting: Nurse Practitioner

## 2020-03-18 ENCOUNTER — Ambulatory Visit (INDEPENDENT_AMBULATORY_CARE_PROVIDER_SITE_OTHER): Payer: BC Managed Care – PPO | Admitting: Nurse Practitioner

## 2020-03-18 VITALS — BP 146/75 | HR 67 | Temp 97.2°F | Resp 16 | Ht 69.0 in | Wt 216.2 lb

## 2020-03-18 DIAGNOSIS — I10 Essential (primary) hypertension: Secondary | ICD-10-CM

## 2020-03-18 DIAGNOSIS — E782 Mixed hyperlipidemia: Secondary | ICD-10-CM | POA: Diagnosis not present

## 2020-03-18 DIAGNOSIS — R300 Dysuria: Secondary | ICD-10-CM | POA: Diagnosis not present

## 2020-03-18 DIAGNOSIS — Z0001 Encounter for general adult medical examination with abnormal findings: Secondary | ICD-10-CM

## 2020-03-18 DIAGNOSIS — E1165 Type 2 diabetes mellitus with hyperglycemia: Secondary | ICD-10-CM

## 2020-03-18 DIAGNOSIS — Z6831 Body mass index (BMI) 31.0-31.9, adult: Secondary | ICD-10-CM

## 2020-03-18 MED ORDER — ROSUVASTATIN CALCIUM 5 MG PO TABS: 5.0000 mg | ORAL_TABLET | Freq: Every day | ORAL | 3 refills | Status: DC

## 2020-03-18 MED ORDER — PHENTERMINE HCL 37.5 MG PO TABS: 37.5000 mg | ORAL_TABLET | Freq: Every day | ORAL | 1 refills | Status: DC

## 2020-03-18 NOTE — Progress Notes (Signed)
[facility] [place], [place] [address]  Internal MEDICINE  Office Visit Note  Patient Name: Jordan Mccoy  [id number]  [id number]  Date of Service: 03/28/2020   Pt is here for routine health maintenance examination   Chief Complaint  Patient presents with   Annual Exam   Hypertension   Diabetes   Quality Metric Gaps    pneumonia vacc and colonoscopy      The patient is here for health maintenance exam. She had labs done recently. Her cholesterol panel remains slightly elevated. Indicates she is at slightly elevated risk for developing CAD in the future. Will start on low dose crestor to reduce risk. Thyroid panel was normal. She has mild vitamin d deficiency which has improved since her last check. Asthma and her blood sugars continue to be well managed.  She has been taking phentermine since her last visit. She has been limiting her calorie intake to 1500 calories and she is exercising routinely. She has lost one pounds since she was last seen. She denies negative side effects since her last visit. Would like to continue    Current Medication: Outpatient Encounter Medications as of 03/18/2020  Medication Sig   albuterol (VENTOLIN HFA) 108 (90 Base) MCG/ACT inhaler Inhale 2 puffs into the lungs every 6 (six) hours as needed for wheezing or shortness of breath.   BD PEN NEEDLE NANO U/F 32G X 4 MM MISC Use as directed with victoza   bisoprolol-hydrochlorothiazide (ZIAC) 10-6.25 MG tablet Take 1 tablet by mouth daily.   Fluticasone-Salmeterol (ADVAIR DISKUS) 250-50 MCG/DOSE AEPB INHALE 1 PUFF(S) TWICE A DAY FOR ASTHMA   glucose blood (ONETOUCH VERIO) test strip Use as directed once a daily diag e11.65   glucose blood test strip Blood sugar testing done once daily - E11.65   hydrochlorothiazide (HYDRODIURIL) 12.5 MG tablet Take 1 tablet (12.5 mg total) by mouth daily.   Lancets (ONETOUCH ULTRASOFT) lancets Blood sugar testing QAM - E11.65    liraglutide (VICTOZA) 18 MG/3ML SOPN INJECT 1.2 MG UNDER THE SKIN ONCE DAILY   phentermine (ADIPEX-P) 37.5 MG tablet Take 1 tablet (37.5 mg total) by mouth daily before breakfast.   [DISCONTINUED] phentermine (ADIPEX-P) 37.5 MG tablet Take 1 tablet (37.5 mg total) by mouth daily before breakfast.   rosuvastatin (CRESTOR) 5 MG tablet Take 1 tablet (5 mg total) by mouth daily.   No facility-administered encounter medications on file as of 03/18/2020.    Surgical History: Past Surgical History:  Procedure Laterality Date   ABDOMINAL HYSTERECTOMY     CHOLECYSTECTOMY     right foot surgery      Medical History: Past Medical History:  Diagnosis Date   Hypertension     Family History: Family History  Problem Relation Age of Onset   Breast cancer Mother 3   Breast cancer Maternal Aunt 63      Review of Systems  Constitutional: Negative for chills, fatigue and unexpected weight change.       One pound weight loss since her most recent visit.   HENT: Negative for congestion, postnasal drip, rhinorrhea, sneezing and sore throat.   Respiratory: Negative for cough, chest tightness, shortness of breath and wheezing.   Cardiovascular: Negative for chest pain and palpitations.  Gastrointestinal: Negative for abdominal pain, constipation, diarrhea, nausea and vomiting.  Endocrine: Negative for cold intolerance, heat intolerance, polydipsia and polyuria.       Blood sugars doing well   Genitourinary: Negative for dysuria, hematuria and urgency.  Musculoskeletal:  Negative for arthralgias, back pain, joint swelling, neck pain and neck stiffness.  Skin: Negative for rash.  Allergic/Immunologic: Negative for environmental allergies.  Neurological: Negative for dizziness, tremors, numbness and headaches.  Hematological: Negative for adenopathy. Does not bruise/bleed easily.  Psychiatric/Behavioral: Negative for behavioral problems (Depression), sleep disturbance and suicidal ideas.  The patient is not nervous/anxious.      Today's Vitals   03/18/20 1506  BP: (!) 146/75  Pulse: 67  Resp: 16  Temp: (!) 97.2 F (36.2 C)  SpO2: 98%  Weight: 216 lb 3.2 oz (98.1 kg)  Height: '5\' 9"'  (1.753 m)   Body mass index is 31.93 kg/m.  Physical Exam Vitals and nursing note reviewed.  Constitutional:      General: She is not in acute distress.    Appearance: Normal appearance. She is well-developed. She is obese. She is not diaphoretic.  HENT:     Head: Normocephalic and atraumatic.     Nose: Nose normal.     Mouth/Throat:     Pharynx: No oropharyngeal exudate.  Eyes:     Pupils: Pupils are equal, round, and reactive to light.  Neck:     Thyroid: No thyromegaly.     Vascular: No JVD.     Trachea: No tracheal deviation.  Cardiovascular:     Rate and Rhythm: Normal rate and regular rhythm.     Pulses: Normal pulses.          Dorsalis pedis pulses are 2+ on the right side and 2+ on the left side.       Posterior tibial pulses are 2+ on the right side and 2+ on the left side.     Heart sounds: Normal heart sounds. No murmur heard.  No friction rub. No gallop.   Pulmonary:     Effort: Pulmonary effort is normal. No respiratory distress.     Breath sounds: Normal breath sounds. No wheezing or rales.  Chest:     Chest wall: No tenderness.     Breasts:        Right: Normal. No swelling, bleeding, inverted nipple, mass, nipple discharge, skin change or tenderness.        Left: Normal. No swelling, bleeding, inverted nipple, mass, nipple discharge, skin change or tenderness.  Abdominal:     General: Bowel sounds are normal.     Palpations: Abdomen is soft.     Tenderness: There is no abdominal tenderness.  Musculoskeletal:        General: Normal range of motion.     Cervical back: Normal range of motion and neck supple.     Right foot: Normal range of motion. No deformity or bunion.     Left foot: Normal range of motion. No deformity or bunion.  Feet:     Right  foot:     Protective Sensation: 10 sites tested. 10 sites sensed.     Skin integrity: Skin integrity normal.     Toenail Condition: Right toenails are normal.     Left foot:     Protective Sensation: 10 sites tested. 10 sites sensed.     Skin integrity: Skin integrity normal.     Toenail Condition: Left toenails are normal.  Lymphadenopathy:     Cervical: No cervical adenopathy.     Upper Body:     Right upper body: No axillary adenopathy.     Left upper body: No axillary adenopathy.  Skin:    General: Skin is warm and dry.  Neurological:  Mental Status: She is alert and oriented to person, place, and time.     Cranial Nerves: No cranial nerve deficit.  Psychiatric:        Mood and Affect: Mood normal.        Behavior: Behavior normal.        Thought Content: Thought content normal.        Judgment: Judgment normal.   LABS: Recent Results (from the past 2160 hour(s))  POCT HgB A1C     Status: Abnormal   Collection Time: 02/02/20  4:20 PM  Result Value Ref Range   Hemoglobin A1C 6.2 (A) 4.0 - 5.6 %   HbA1c POC (<> result, manual entry)     HbA1c, POC (prediabetic range)     HbA1c, POC (controlled diabetic range)    Comprehensive metabolic panel     Status: None   Collection Time: 02/20/20  8:34 AM  Result Value Ref Range   Glucose 93 65 - 99 mg/dL   BUN 17 6 - 24 mg/dL   Creatinine, Ser 0.99 0.57 - 1.00 mg/dL   GFR calc non Af Amer 66 >59 mL/min/1.73   GFR calc Af Amer 76 >59 mL/min/1.73    Comment: **[facility] currently reports eGFR in compliance with the current**   recommendations of the [employer]. [facility] will   update reporting as new guidelines are published from the NKF-ASN   Task force.    BUN/Creatinine Ratio 17 9 - 23   Sodium 139 134 - 144 mmol/L   Potassium 4.2 3.5 - 5.2 mmol/L   Chloride 100 96 - 106 mmol/L   CO2 25 20 - 29 mmol/L   Calcium 10.0 8.7 - 10.2 mg/dL   Total Protein 7.5 6.0 - 8.5 g/dL   Albumin 4.6 3.8 - 4.9 g/dL    Globulin, Total 2.9 1.5 - 4.5 g/dL   Albumin/Globulin Ratio 1.6 1.2 - 2.2   Bilirubin Total 0.2 0.0 - 1.2 mg/dL   Alkaline Phosphatase 64 39 - 117 IU/L    Comment: **Effective Feb 23, 2020 Alkaline Phosphatase**   reference interval will be changing to:              Age                Female          Female           0 -  5 days         5 - 127       2 - 127           6 - 10 days         56 - 242       28 - 242          34 - 20 days        114 - 357      114 - 357          21 - 30 days        107 - 494      107 - 494           1 -  2 months      162 - 539      162 - 539           3 -  6 months      141 - 452      141 - 452  7 - 11 months      128 - 401      128 - 401   12 months -  6 years       170 - 369      170 - 369           7 - 12 years       161 - 409      161 - 409               13 years       166 - 435       6 - 227               14 years       121 - 375       72 - 161               15 years        88 - 279       64 - 134               16 years        60 - 207       2 - 121               17 years        40 - 161       55 - 113          77 - 20 years        35 - 125       26 - 106              >20 years         48 - 121       48 - 121    AST 17 0 - 40 IU/L   ALT 17 0 - 32 IU/L  CBC     Status: None   Collection Time: 02/20/20  8:34 AM  Result Value Ref Range   WBC 10.1 3.4 - 10.8 x10E3/uL   RBC 5.08 3.77 - 5.28 x10E6/uL   Hemoglobin 13.5 11.1 - 15.9 g/dL   Hematocrit 40.0 34.0 - 46.6 %   MCV 79 79 - 97 fL   MCH 26.6 26.6 - 33.0 pg   MCHC 33.8 31 - 35 g/dL   RDW 12.9 11.7 - 15.4 %   Platelets 216 150 - 450 x10E3/uL  Lipid Panel With LDL/HDL Ratio     Status: Abnormal   Collection Time: 02/20/20  8:34 AM  Result Value Ref Range   Cholesterol, Total 226 (H) 100 - 199 mg/dL   Triglycerides 209 (H) 0 - 149 mg/dL   HDL 41 >39 mg/dL   VLDL Cholesterol Cal 38 5 - 40 mg/dL   LDL Chol Calc (NIH) 147 (H) 0 - 99 mg/dL   LDL/HDL Ratio 3.6 (H) 0.0 - 3.2 ratio     Comment:                                     LDL/HDL Ratio                                             Men  Women  1/2 Avg.Risk  1.0    1.5                                   Avg.Risk  3.6    3.2                                2X Avg.Risk  6.2    5.0                                3X Avg.Risk  8.0    6.1   T4, free     Status: None   Collection Time: 02/20/20  8:34 AM  Result Value Ref Range   Free T4 1.31 0.82 - 1.77 ng/dL  TSH     Status: None   Collection Time: 02/20/20  8:34 AM  Result Value Ref Range   TSH 1.280 0.450 - 4.500 uIU/mL  VITAMIN D 25 Hydroxy (Vit-D Deficiency, Fractures)     Status: Abnormal   Collection Time: 02/20/20  8:34 AM  Result Value Ref Range   Vit D, 25-Hydroxy 26.2 (L) 30.0 - 100.0 ng/mL    Comment: Vitamin D deficiency has been defined by the [place] and an Endocrine Society practice guideline as a level of serum 25-OH vitamin D less than 20 ng/mL (1,2). The Endocrine Society went on to further define vitamin D insufficiency as a level between 21 and 29 ng/mL (2). 1. IOM (Institute of Medicine). 2010. Dietary reference    intakes for calcium and D. [place]: The    [employer]. 2. Holick MF, Binkley Pyote, Bischoff-Ferrari HA, et al.    Evaluation, treatment, and prevention of vitamin D    deficiency: an Endocrine Society clinical practice    guideline. JCEM. 2011 Jul; 96(7):1911-30.   Urinalysis, Routine w reflex microscopic     Status: None   Collection Time: 03/18/20  3:05 PM  Result Value Ref Range   Specific Gravity, UA 1.023 1.005 - 1.030   pH, UA 5.5 5.0 - 7.5   Color, UA Yellow Yellow   Appearance Ur Clear Clear   Leukocytes,UA Negative Negative   Protein,UA Negative Negative/Trace   Glucose, UA Negative Negative   Ketones, UA Negative Negative   RBC, UA Negative Negative   Bilirubin, UA Negative Negative   Urobilinogen, Ur 0.2 0.2 - 1.0 mg/dL   Nitrite, UA Negative Negative    Microscopic Examination Comment     Comment: Microscopic not indicated and not performed.    Assessment/Plan: 1. Encounter for general adult medical examination with abnormal findings Annual health maintenance exam today.   2. Type 2 diabetes mellitus with hyperglycemia, without long-term current use of insulin ([place]) Continue diabetic medication as prescribed   3. Benign essential hypertension Stable. Continue bp medication as prescribed   4. Mixed hyperlipidemia Reviewed labs with mildly elevated lipid panel. Add crestor 54m daily. Advised she take this with OTC CoQ10 to prevent joint and muscle pain. Recheck in 3 months.  - rosuvastatin (CRESTOR) 5 MG tablet; Take 1 tablet (5 mg total) by mouth daily.  Dispense: 30 tablet; Refill: 3  5. Body mass index (BMI) 31.0-31.9, adult Improving. Continue phentermine daily. Limit calorie intake to 1200 calories per day and continue with routine, daily exercise.  - phentermine (ADIPEX-P) 37.5  MG tablet; Take 1 tablet (37.5 mg total) by mouth daily before breakfast.  Dispense: 30 tablet; Refill: 1  6. Dysuria - Urinalysis, Routine w reflex microscopic  General Counseling: Pamla verbalizes understanding of the findings of todays visit and agrees with plan of treatment. I have discussed any further diagnostic evaluation that may be needed or ordered today. We also reviewed her medications today. she has been encouraged to call the office with any questions or concerns that should arise related to todays visit.    Counseling:   There is a liability release in patients' chart. There has been a 10 minute discussion about the side effects including but not limited to elevated blood pressure, anxiety, lack of sleep and dry mouth. Pt understands and will like to start/continue on appetite suppressant at this time. There will be one month RX given at the time of visit with proper follow up. [place] diet plan with restricted calories is given to the pt.  Pt understands and agrees with  plan of treatment  This patient was seen by Leretha Pol FNP Collaboration with Dr Lavera Guise as a part of collaborative care agreement  Orders Placed This Encounter  Procedures   Urinalysis, Routine w reflex microscopic    Meds ordered this encounter  Medications   rosuvastatin (CRESTOR) 5 MG tablet    Sig: Take 1 tablet (5 mg total) by mouth daily.    Dispense:  30 tablet    Refill:  3    Order Specific Question:   Supervising Provider    Answer:   Lavera Guise [[id number]]   phentermine (ADIPEX-P) 37.5 MG tablet    Sig: Take 1 tablet (37.5 mg total) by mouth daily before breakfast.    Dispense:  30 tablet    Refill:  1    Order Specific Question:   Supervising Provider    Answer:   Lavera Guise [[id number]]    Total time spent: 47 Minutes  Time spent includes review of chart, medications, test results, and follow up plan with the patient.     Lavera Guise, MD  Internal Medicine

## 2020-03-19 LAB — URINALYSIS, ROUTINE W REFLEX MICROSCOPIC
Bilirubin, UA: NEGATIVE
Glucose, UA: NEGATIVE
Ketones, UA: NEGATIVE
Leukocytes,UA: NEGATIVE
Nitrite, UA: NEGATIVE
Protein,UA: NEGATIVE
RBC, UA: NEGATIVE
Specific Gravity, UA: 1.023 (ref 1.005–1.030)
Urobilinogen, Ur: 0.2 mg/dL (ref 0.2–1.0)
pH, UA: 5.5 (ref 5.0–7.5)

## 2020-03-23 ENCOUNTER — Encounter: Payer: BC Managed Care – PPO | Admitting: Speech Pathology

## 2020-03-28 DIAGNOSIS — R300 Dysuria: Secondary | ICD-10-CM | POA: Insufficient documentation

## 2020-03-28 DIAGNOSIS — E782 Mixed hyperlipidemia: Secondary | ICD-10-CM | POA: Insufficient documentation

## 2020-03-28 DIAGNOSIS — Z0001 Encounter for general adult medical examination with abnormal findings: Secondary | ICD-10-CM | POA: Insufficient documentation

## 2020-03-30 ENCOUNTER — Encounter: Payer: BC Managed Care – PPO | Admitting: Speech Pathology

## 2020-04-01 ENCOUNTER — Encounter: Payer: BC Managed Care – PPO | Admitting: Speech Pathology

## 2020-04-06 ENCOUNTER — Encounter: Payer: BC Managed Care – PPO | Admitting: Speech Pathology

## 2020-04-08 ENCOUNTER — Encounter: Payer: BC Managed Care – PPO | Admitting: Speech Pathology

## 2020-04-21 ENCOUNTER — Other Ambulatory Visit: Payer: Self-pay

## 2020-04-21 MED ORDER — BISOPROLOL-HYDROCHLOROTHIAZIDE 10-6.25 MG PO TABS: 1.0000 | ORAL_TABLET | Freq: Every day | ORAL | 6 refills | Status: DC

## 2020-04-29 ENCOUNTER — Ambulatory Visit: Payer: BC Managed Care – PPO | Admitting: Nurse Practitioner

## 2020-05-21 ENCOUNTER — Telehealth: Payer: Self-pay

## 2020-05-21 NOTE — Telephone Encounter (Signed)
Faxed Cologuard paperwork for patient.  

## 2020-05-25 ENCOUNTER — Telehealth: Payer: Self-pay

## 2020-05-25 NOTE — Telephone Encounter (Signed)
Lmom to confirm and screen for 05-27-20 ov. °

## 2020-05-27 ENCOUNTER — Ambulatory Visit: Payer: BC Managed Care – PPO | Admitting: Nurse Practitioner

## 2020-05-27 ENCOUNTER — Other Ambulatory Visit: Payer: Self-pay

## 2020-05-27 ENCOUNTER — Encounter: Payer: Self-pay | Admitting: Nurse Practitioner

## 2020-05-27 VITALS — BP 140/61 | HR 69 | Temp 97.6°F | Resp 16 | Ht 69.0 in | Wt 218.8 lb

## 2020-05-27 DIAGNOSIS — Z6832 Body mass index (BMI) 32.0-32.9, adult: Secondary | ICD-10-CM

## 2020-05-27 DIAGNOSIS — E1165 Type 2 diabetes mellitus with hyperglycemia: Secondary | ICD-10-CM

## 2020-05-27 DIAGNOSIS — I10 Essential (primary) hypertension: Secondary | ICD-10-CM

## 2020-05-27 LAB — POCT GLYCOSYLATED HEMOGLOBIN (HGB A1C): Hemoglobin A1C: 6.2 % — AB (ref 4.0–5.6)

## 2020-05-27 MED ORDER — VICTOZA 18 MG/3ML ~~LOC~~ SOPN: PEN_INJECTOR | SUBCUTANEOUS | 2 refills | Status: DC

## 2020-05-27 NOTE — Progress Notes (Signed)
[facility] [address] [place], [place] [address]  Internal MEDICINE  Office Visit Note  Patient Name: Jordan Mccoy  [id number]  [id number]  Date of Service: 06/13/2020  Chief Complaint  Patient presents with  . Follow-up    pt feels like she is over eating, refill request phentermine  . Hypertension  . Diabetes  . Quality Metric Gaps    TDAP, HIV screening, HepC    The patient is here for routine follow up. Her blood sugars are doing well. She is taking victoza 1.2mg  daily. Her HgbA1c is 6.2 today. She has been taking phentermine off and on to help with weight management. She has had weight gain of 2 pounds since her last visit. Her blood pressure is well controlled. Asthma has been managed well also. She admits to less exercise than recommended. She is limiting her calorie intake to 1500 calories per day.       Current Medication: Outpatient Encounter Medications as of 05/27/2020  Medication Sig Note  . albuterol (VENTOLIN HFA) 108 (90 Base) MCG/ACT inhaler Inhale 2 puffs into the lungs every 6 (six) hours as needed for wheezing or shortness of breath.   . BD PEN NEEDLE NANO U/F 32G X 4 MM MISC Use as directed with victoza   . bisoprolol-hydrochlorothiazide (ZIAC) 10-6.25 MG tablet Take 1 tablet by mouth daily.   . Fluticasone-Salmeterol (ADVAIR DISKUS) 250-50 MCG/DOSE AEPB INHALE 1 PUFF(S) TWICE A DAY FOR ASTHMA   . glucose blood (ONETOUCH VERIO) test strip Use as directed once a daily diag e11.65   . glucose blood test strip Blood sugar testing done once daily - E11.65   . hydrochlorothiazide (HYDRODIURIL) 12.5 MG tablet Take 1 tablet (12.5 mg total) by mouth daily.   . Lancets (ONETOUCH ULTRASOFT) lancets Blood sugar testing QAM - E11.65   . liraglutide (VICTOZA) 18 MG/3ML SOPN Inject 1.8mg  [place] once daily.   . rosuvastatin (CRESTOR) 5 MG tablet Take 1 tablet (5 mg total) by mouth daily.   . [DISCONTINUED] liraglutide (VICTOZA) 18 MG/3ML SOPN INJECT 1.2 MG UNDER  THE SKIN ONCE DAILY   . [DISCONTINUED] phentermine (ADIPEX-P) 37.5 MG tablet Take 1 tablet (37.5 mg total) by mouth daily before breakfast. 05/27/2020: increase dose of victoza   No facility-administered encounter medications on file as of 05/27/2020.    Surgical History: Past Surgical History:  Procedure Laterality Date  . ABDOMINAL HYSTERECTOMY    . CHOLECYSTECTOMY    . right foot surgery      Medical History: Past Medical History:  Diagnosis Date  . Diabetes mellitus without complication (HCC)   . Hypertension     Family History: Family History  Problem Relation Age of Onset  . Breast cancer Mother 4  . Breast cancer Maternal Aunt 63  . Cancer Father     Social History   Socioeconomic History  . Marital status: Single    Spouse name: Not on file  . Number of children: Not on file  . Years of education: Not on file  . Highest education level: Not on file  Occupational History  . Not on file  Tobacco Use  . Smoking status: Never Smoker  . Smokeless tobacco: Never Used  Vaping Use  . Vaping Use: Never used  Substance and Sexual Activity  . Alcohol use: Yes    Comment: social  . Drug use: No  . Sexual activity: Not on file  Other Topics Concern  . Not on file  Social History Narrative  .  Not on file   Social Determinants of Health   Financial Resource Strain:   . Difficulty of Paying Living Expenses: Not on file  Food Insecurity:   . Worried About Programme researcher, broadcasting/film/video in the Last Year: Not on file  . Ran Out of Food in the Last Year: Not on file  Transportation Needs:   . Lack of Transportation (Medical): Not on file  . Lack of Transportation (Non-Medical): Not on file  Physical Activity:   . Days of Exercise per Week: Not on file  . Minutes of Exercise per Session: Not on file  Stress:   . Feeling of Stress : Not on file  Social Connections:   . Frequency of Communication with Friends and Family: Not on file  . Frequency of Social Gatherings with  Friends and Family: Not on file  . Attends Religious Services: Not on file  . Active Member of Clubs or Organizations: Not on file  . Attends Banker Meetings: Not on file  . Marital Status: Not on file  Intimate Partner Violence:   . Fear of Current or Ex-Partner: Not on file  . Emotionally Abused: Not on file  . Physically Abused: Not on file  . Sexually Abused: Not on file      Review of Systems  Constitutional: Negative for chills, fatigue and unexpected weight change.       Two pound weight gain since her last visit.   HENT: Negative for congestion, postnasal drip, rhinorrhea, sneezing and sore throat.   Respiratory: Negative for cough, chest tightness, shortness of breath and wheezing.   Cardiovascular: Negative for chest pain and palpitations.  Gastrointestinal: Negative for abdominal pain, constipation, diarrhea, nausea and vomiting.  Endocrine: Negative for cold intolerance, heat intolerance, polydipsia and polyuria.       Blood sugars doing well   Musculoskeletal: Negative for arthralgias, back pain, joint swelling, neck pain and neck stiffness.  Skin: Negative for rash.  Allergic/Immunologic: Negative for environmental allergies.  Neurological: Negative for dizziness, tremors, numbness and headaches.  Hematological: Negative for adenopathy. Does not bruise/bleed easily.  Psychiatric/Behavioral: Negative for behavioral problems (Depression), sleep disturbance and suicidal ideas. The patient is not nervous/anxious.     Today's Vitals   05/27/20 1604  BP: 140/61  Pulse: 69  Resp: 16  Temp: 97.6 F (36.4 C)  SpO2: 99%  Weight: 218 lb 12.8 oz (99.2 kg)  Height: 5\' 9"  (1.753 m)   Body mass index is 32.31 kg/m.  Physical Exam Vitals and nursing note reviewed.  Constitutional:      General: She is not in acute distress.    Appearance: Normal appearance. She is well-developed. She is obese. She is not diaphoretic.  HENT:     Head: Normocephalic and  atraumatic.     Nose: Nose normal.     Mouth/Throat:     Pharynx: No oropharyngeal exudate.  Eyes:     Pupils: Pupils are equal, round, and reactive to light.  Neck:     Thyroid: No thyromegaly.     Vascular: No JVD.     Trachea: No tracheal deviation.  Cardiovascular:     Rate and Rhythm: Normal rate and regular rhythm.     Heart sounds: Normal heart sounds. No murmur heard.  No friction rub. No gallop.   Pulmonary:     Effort: Pulmonary effort is normal. No respiratory distress.     Breath sounds: Normal breath sounds. No wheezing or rales.  Chest:  Chest wall: No tenderness.  Abdominal:     Palpations: Abdomen is soft.  Musculoskeletal:        General: Normal range of motion.     Cervical back: Normal range of motion and neck supple.  Lymphadenopathy:     Cervical: No cervical adenopathy.  Skin:    General: Skin is warm and dry.  Neurological:     Mental Status: She is alert and oriented to person, place, and time.     Cranial Nerves: No cranial nerve deficit.  Psychiatric:        Mood and Affect: Mood normal.        Behavior: Behavior normal.        Thought Content: Thought content normal.        Judgment: Judgment normal.    Assessment/Plan: 1. Type 2 diabetes mellitus with hyperglycemia, without long-term current use of insulin (HCC) - POCT HgB A1C 6.2 today. Will increase victoza to 1.8mg  daily to help control blood sugars and improve weight.  - liraglutide (VICTOZA) 18 MG/3ML SOPN; Inject 1.8mg  [place] once daily.  Dispense: 9 mL; Refill: 2   2. BMI 32.0-32.9,adult Hold phentermine for now. Will increase victoza to 1.8mg  daily to help control blood sugars and improve weight. Encourage a 1500 calorie intake daily and advised her to incorporate exercise into her daily routine.    3. Benign essential hypertension Stable. Continue bp medication as prescribed    General Counseling: analie katzman understanding of the findings of todays visit and agrees with  plan of treatment. I have discussed any further diagnostic evaluation that may be needed or ordered today. We also reviewed her medications today. she has been encouraged to call the office with any questions or concerns that should arise related to todays visit.  This patient was seen by Vincent Gros FNP Collaboration with Dr Lyndon Code as a part of collaborative care agreement  Orders Placed This Encounter  Procedures  . POCT HgB A1C    Meds ordered this encounter  Medications  . liraglutide (VICTOZA) 18 MG/3ML SOPN    Sig: Inject 1.8mg  [place] once daily.    Dispense:  9 mL    Refill:  2    Increased dose to 1.40mb daily.    Order Specific Question:   Supervising Provider    Answer:   Lyndon Code [[id number]]    Total time spent: 30 Minutes   Time spent includes review of chart, medications, test results, and follow up plan with the patient.      Dr Lyndon Code Internal medicine

## 2020-06-15 ENCOUNTER — Other Ambulatory Visit: Payer: Self-pay

## 2020-06-15 MED ORDER — FLUTICASONE-SALMETEROL 250-50 MCG/DOSE IN AEPB: INHALATION_SPRAY | RESPIRATORY_TRACT | 5 refills | Status: DC

## 2020-06-30 NOTE — Telephone Encounter (Signed)
Error

## 2020-07-05 ENCOUNTER — Other Ambulatory Visit: Payer: Self-pay

## 2020-07-05 DIAGNOSIS — R062 Wheezing: Secondary | ICD-10-CM

## 2020-07-05 MED ORDER — ALBUTEROL SULFATE HFA 108 (90 BASE) MCG/ACT IN AERS: 2.0000 | INHALATION_SPRAY | Freq: Four times a day (QID) | RESPIRATORY_TRACT | 3 refills | Status: DC | PRN

## 2020-07-08 ENCOUNTER — Ambulatory Visit: Payer: BC Managed Care – PPO | Admitting: Nurse Practitioner

## 2020-07-29 DIAGNOSIS — Z1212 Encounter for screening for malignant neoplasm of rectum: Secondary | ICD-10-CM | POA: Diagnosis not present

## 2020-07-29 DIAGNOSIS — Z1211 Encounter for screening for malignant neoplasm of colon: Secondary | ICD-10-CM | POA: Diagnosis not present

## 2020-08-11 LAB — COLOGUARD: COLOGUARD: NEGATIVE

## 2020-08-11 LAB — EXTERNAL GENERIC LAB PROCEDURE: COLOGUARD: NEGATIVE

## 2020-08-16 ENCOUNTER — Telehealth: Payer: Self-pay

## 2020-08-16 ENCOUNTER — Other Ambulatory Visit: Payer: Self-pay

## 2020-08-16 DIAGNOSIS — E1165 Type 2 diabetes mellitus with hyperglycemia: Secondary | ICD-10-CM

## 2020-08-16 MED ORDER — VICTOZA 18 MG/3ML ~~LOC~~ SOPN: PEN_INJECTOR | SUBCUTANEOUS | 3 refills | Status: DC

## 2020-08-16 NOTE — Telephone Encounter (Signed)
PA for Victoza 18MG /3ML pen-injectors approved from 08-06-20 to 08-05-21

## 2020-09-23 LAB — COLOGUARD

## 2020-09-23 NOTE — Progress Notes (Signed)
Negative cologuard

## 2020-10-18 ENCOUNTER — Other Ambulatory Visit: Payer: Self-pay

## 2020-10-18 MED ORDER — BISOPROLOL-HYDROCHLOROTHIAZIDE 10-6.25 MG PO TABS: 1.0000 | ORAL_TABLET | Freq: Every day | ORAL | 1 refills | Status: DC

## 2020-10-21 ENCOUNTER — Other Ambulatory Visit: Payer: Self-pay

## 2020-10-21 ENCOUNTER — Ambulatory Visit: Payer: BC Managed Care – PPO | Admitting: Internal Medicine

## 2020-10-21 ENCOUNTER — Encounter: Payer: Self-pay | Admitting: Internal Medicine

## 2020-10-21 DIAGNOSIS — E7800 Pure hypercholesterolemia, unspecified: Secondary | ICD-10-CM | POA: Insufficient documentation

## 2020-10-21 DIAGNOSIS — R062 Wheezing: Secondary | ICD-10-CM

## 2020-10-21 DIAGNOSIS — E1165 Type 2 diabetes mellitus with hyperglycemia: Secondary | ICD-10-CM

## 2020-10-21 DIAGNOSIS — E782 Mixed hyperlipidemia: Secondary | ICD-10-CM

## 2020-10-21 DIAGNOSIS — Z6832 Body mass index (BMI) 32.0-32.9, adult: Secondary | ICD-10-CM

## 2020-10-21 DIAGNOSIS — J4520 Mild intermittent asthma, uncomplicated: Secondary | ICD-10-CM

## 2020-10-21 DIAGNOSIS — I10 Essential (primary) hypertension: Secondary | ICD-10-CM | POA: Diagnosis not present

## 2020-10-21 LAB — POCT GLYCOSYLATED HEMOGLOBIN (HGB A1C): Hemoglobin A1C: 6.2 % — AB (ref 4.0–5.6)

## 2020-10-21 MED ORDER — ALBUTEROL SULFATE HFA 108 (90 BASE) MCG/ACT IN AERS: 2.0000 | INHALATION_SPRAY | Freq: Four times a day (QID) | RESPIRATORY_TRACT | 3 refills | Status: DC | PRN

## 2020-10-21 NOTE — Progress Notes (Signed)
[facility] [address] [place], [place] [address]  Internal MEDICINE  Office Visit Note  Patient Name: Jordan Mccoy  [id number]  [id number]  Date of Service: 10/21/2020  Chief Complaint  Patient presents with  . Follow-up    Refill request  . Diabetes  . Hypertension  . Quality Metric Gaps    pap    HPI  She is here for routine follow up and is doing well. She received her Covid Booster October 07, 2020  Blood sugars at home are usually 120. Not dizzy or lightheaded. A1c today is 6.2 which is the same as last visit. Doesn't think Victoza is working for weight loss. Has gained 5 pounds since last visit. She is working on diet and activity. She is working part time and is moving around more.  Samples of Jardiance 10mg  daily for 3 weeks, then follow up. Also discussed keeping a food journal and tracking her steps with her smart watch. We performed a metabolic test in office and we discussed a Calorie goal of 1500 per day and at least a 3 pound weight loss by next visit in 3 weeks. BP elevated in office, but normally well controlled at home.   Current Medication: Outpatient Encounter Medications as of 10/21/2020  Medication Sig  . albuterol (VENTOLIN HFA) 108 (90 Base) MCG/ACT inhaler Inhale 2 puffs into the lungs every 6 (six) hours as needed for wheezing or shortness of breath.  . BD PEN NEEDLE NANO U/F 32G X 4 MM MISC Use as directed with victoza  . bisoprolol-hydrochlorothiazide (ZIAC) 10-6.25 MG tablet Take 1 tablet by mouth daily.  . Fluticasone-Salmeterol (ADVAIR DISKUS) 250-50 MCG/DOSE AEPB INHALE 1 PUFF(S) TWICE A DAY FOR ASTHMA  . glucose blood (ONETOUCH VERIO) test strip Use as directed once a daily diag e11.65  . glucose blood test strip Blood sugar testing done once daily - E11.65  . hydrochlorothiazide (HYDRODIURIL) 12.5 MG tablet Take 1 tablet (12.5 mg total) by mouth daily.  . Lancets (ONETOUCH ULTRASOFT) lancets Blood sugar testing QAM - E11.65  .  liraglutide (VICTOZA) 18 MG/3ML SOPN Inject 1.8mg  [place] once daily.  . rosuvastatin (CRESTOR) 5 MG tablet Take 1 tablet (5 mg total) by mouth daily.  . [DISCONTINUED] albuterol (VENTOLIN HFA) 108 (90 Base) MCG/ACT inhaler Inhale 2 puffs into the lungs every 6 (six) hours as needed for wheezing or shortness of breath.   No facility-administered encounter medications on file as of 10/21/2020.    Surgical History: Past Surgical History:  Procedure Laterality Date  . ABDOMINAL HYSTERECTOMY    . CHOLECYSTECTOMY    . right foot surgery      Medical History: Past Medical History:  Diagnosis Date  . Diabetes mellitus without complication (HCC)   . Hypertension     Family History: Family History  Problem Relation Age of Onset  . Breast cancer Mother 57  . Breast cancer Maternal Aunt 63  . Cancer Father     Social History   Socioeconomic History  . Marital status: Single    Spouse name: Not on file  . Number of children: Not on file  . Years of education: Not on file  . Highest education level: Not on file  Occupational History  . Not on file  Tobacco Use  . Smoking status: Never Smoker  . Smokeless tobacco: Never Used  Vaping Use  . Vaping Use: Never used  Substance and Sexual Activity  . Alcohol use: Yes    Comment: social  .  Drug use: No  . Sexual activity: Not on file  Other Topics Concern  . Not on file  Social History Narrative  . Not on file   Social Determinants of Health   Financial Resource Strain: Not on file  Food Insecurity: Not on file  Transportation Needs: Not on file  Physical Activity: Not on file  Stress: Not on file  Social Connections: Not on file  Intimate Partner Violence: Not on file      Review of Systems  Constitutional: Negative for chills, fatigue and unexpected weight change.  HENT: Negative for congestion, postnasal drip, rhinorrhea, sneezing and sore throat.   Eyes: Negative for redness.  Respiratory: Negative for cough, chest  tightness and shortness of breath.   Cardiovascular: Negative for chest pain and palpitations.  Gastrointestinal: Negative for abdominal pain, constipation, diarrhea, nausea and vomiting.  Genitourinary: Negative for dysuria and frequency.  Musculoskeletal: Negative for arthralgias, back pain, joint swelling and neck pain.  Skin: Negative for rash.  Neurological: Negative.  Negative for tremors and numbness.  Hematological: Negative for adenopathy. Does not bruise/bleed easily.  Psychiatric/Behavioral: Negative for behavioral problems (Depression), sleep disturbance and suicidal ideas. The patient is not nervous/anxious.     Vital Signs: BP (!) 142/80   Pulse 83   Temp 97.7 F (36.5 C)   Resp 16   Ht 5\' 9"  (1.753 m)   Wt 223 lb 12.8 oz (101.5 kg)   SpO2 97%   BMI 33.05 kg/m    Physical Exam Constitutional:      General: She is not in acute distress.    Appearance: She is well-developed and well-nourished. She is obese. She is not diaphoretic.  HENT:     Head: Normocephalic and atraumatic.     Mouth/Throat:     Mouth: Oropharynx is clear and moist.     Pharynx: No oropharyngeal exudate.  Eyes:     Extraocular Movements: EOM normal.     Pupils: Pupils are equal, round, and reactive to light.  Neck:     Thyroid: No thyromegaly.     Vascular: No JVD.     Trachea: No tracheal deviation.  Cardiovascular:     Rate and Rhythm: Normal rate and regular rhythm.     Heart sounds: Normal heart sounds. No murmur heard. No friction rub. No gallop.   Pulmonary:     Effort: Pulmonary effort is normal. No respiratory distress.     Breath sounds: No wheezing or rales.  Chest:     Chest wall: No tenderness.  Abdominal:     General: Bowel sounds are normal.     Palpations: Abdomen is soft.  Musculoskeletal:        General: Normal range of motion.     Cervical back: Normal range of motion and neck supple.  Lymphadenopathy:     Cervical: No cervical adenopathy.  Skin:    General:  Skin is warm and dry.  Neurological:     Mental Status: She is alert and oriented to person, place, and time.     Cranial Nerves: No cranial nerve deficit.  Psychiatric:        Mood and Affect: Mood and affect normal.        Behavior: Behavior normal.        Thought Content: Thought content normal.        Judgment: Judgment normal.        Assessment/Plan: 1. Type 2 diabetes mellitus with hyperglycemia, without long-term current use of insulin (HCC)  A1c stable at 6.2. Continue Victoza. - POCT HgB A1C  2. BMI 32.0-32.9,adult She will keep a food log to help manage her diet and calorie intake. She will also try to utilize her smart watch in order to track her activity and exercise. Drtermined a goal of a least 3 pound weight loss by next visit in 3 weeks. Gave samples of Jardiance 10mg  tab to help aid her weight loss. Will reassess at next visit and try to get prescription if beneficial. Obesity Counseling: Risk Assessment: An assessment of behavioral risk factors was made today and includes lack of exercise sedentary lifestyle, lack of portion control and poor dietary habits.  Risk Modification Advice: She was counseled on portion control guidelines. Restricting daily caloric intake to 1500. The detrimental long term effects of obesity on her health and ongoing poor compliance was also discussed with the patient.  - Metabolic Test performed in office and used to guide recommendations for caloric intake.  3. Essential hypertension BP elevated today, but has been stable. Continue current medications.  4. Wheezing Occasional wheezing seconday to asthma. Will continue current medications. - albuterol (VENTOLIN HFA) 108 (90 Base) MCG/ACT inhaler; Inhale 2 puffs into the lungs every 6 (six) hours as needed for wheezing or shortness of breath.  Dispense: 18 g; Refill: 3  5. Mild intermittent asthma without complication Continue current medications, - albuterol (VENTOLIN HFA) 108 (90  Base) MCG/ACT inhaler; Inhale 2 puffs into the lungs every 6 (six) hours as needed for wheezing or shortness of breath.  Dispense: 18 g; Refill: 3  6. High cholesterol Plan to discuss possible statin initiation at next visit to help control cholesterol in addition to dietary changes.  General Counseling: marine lezotte understanding of the findings of todays visit and agrees with plan of treatment. I have discussed any further diagnostic evaluation that may be needed or ordered today. We also reviewed her medications today. she has been encouraged to call the office with any questions or concerns that should arise related to todays visit.    Orders Placed This Encounter  Procedures  . Metabolic Test  . POCT HgB A1C    Meds ordered this encounter  Medications  . albuterol (VENTOLIN HFA) 108 (90 Base) MCG/ACT inhaler    Sig: Inhale 2 puffs into the lungs every 6 (six) hours as needed for wheezing or shortness of breath.    Dispense:  18 g    Refill:  3    Total time spent: 30 Minutes Time spent includes review of chart, medications, test results, and follow up plan with the patient.      Dr Nancy Nordmann Internal medicine

## 2020-10-21 NOTE — Patient Instructions (Signed)
Calorie Counting for Weight Loss Calories are units of energy. Your body needs a certain number of calories from food to keep going throughout the day. When you eat or drink more calories than your body needs, your body stores the extra calories mostly as fat. When you eat or drink fewer calories than your body needs, your body burns fat to get the energy it needs. Calorie counting means keeping track of how many calories you eat and drink each day. Calorie counting can be helpful if you need to lose weight. If you eat fewer calories than your body needs, you should lose weight. Ask your health care provider what a healthy weight is for you. For calorie counting to work, you will need to eat the right number of calories each day to lose a healthy amount of weight per week. A dietitian can help you figure out how many calories you need in a day and will suggest ways to reach your calorie goal.  A healthy amount of weight to lose each week is usually 1-2 lb (0.5-0.9 kg). This usually means that your daily calorie intake should be reduced by 500-750 calories.  Eating 1,200-1,500 calories a day can help most women lose weight.  Eating 1,500-1,800 calories a day can help most men lose weight. What do I need to know about calorie counting? Work with your health care provider or dietitian to determine how many calories you should get each day. To meet your daily calorie goal, you will need to:  Find out how many calories are in each food that you would like to eat. Try to do this before you eat.  Decide how much of the food you plan to eat.  Keep a food log. Do this by writing down what you ate and how many calories it had. To successfully lose weight, it is important to balance calorie counting with a healthy lifestyle that includes regular activity. Where do I find calorie information? The number of calories in a food can be found on a Nutrition Facts label. If a food does not have a Nutrition Facts  label, try to look up the calories online or ask your dietitian for help. Remember that calories are listed per serving. If you choose to have more than one serving of a food, you will have to multiply the calories per serving by the number of servings you plan to eat. For example, the label on a package of bread might say that a serving size is 1 slice and that there are 90 calories in a serving. If you eat 1 slice, you will have eaten 90 calories. If you eat 2 slices, you will have eaten 180 calories.   How do I keep a food log? After each time that you eat, record the following in your food log as soon as possible:  What you ate. Be sure to include toppings, sauces, and other extras on the food.  How much you ate. This can be measured in cups, ounces, or number of items.  How many calories were in each food and drink.  The total number of calories in the food you ate. Keep your food log near you, such as in a pocket-sized notebook or on an app or website on your mobile phone. Some programs will calculate calories for you and show you how many calories you have left to meet your daily goal. What are some portion-control tips?  Know how many calories are in a serving. This will   help you know how many servings you can have of a certain food.  Use a measuring cup to measure serving sizes. You could also try weighing out portions on a kitchen scale. With time, you will be able to estimate serving sizes for some foods.  Take time to put servings of different foods on your favorite plates or in your favorite bowls and cups so you know what a serving looks like.  Try not to eat straight from a food's packaging, such as from a bag or box. Eating straight from the package makes it hard to see how much you are eating and can lead to overeating. Put the amount you would like to eat in a cup or on a plate to make sure you are eating the right portion.  Use smaller plates, glasses, and bowls for smaller  portions and to prevent overeating.  Try not to multitask. For example, avoid watching TV or using your computer while eating. If it is time to eat, sit down at a table and enjoy your food. This will help you recognize when you are full. It will also help you be more mindful of what and how much you are eating. What are tips for following this plan? Reading food labels  Check the calorie count compared with the serving size. The serving size may be smaller than what you are used to eating.  Check the source of the calories. Try to choose foods that are high in protein, fiber, and vitamins, and low in saturated fat, trans fat, and sodium. Shopping  Read nutrition labels while you shop. This will help you make healthy decisions about which foods to buy.  Pay attention to nutrition labels for low-fat or fat-free foods. These foods sometimes have the same number of calories or more calories than the full-fat versions. They also often have added sugar, starch, or salt to make up for flavor that was removed with the fat.  Make a grocery list of lower-calorie foods and stick to it. Cooking  Try to cook your favorite foods in a healthier way. For example, try baking instead of frying.  Use low-fat dairy products. Meal planning  Use more fruits and vegetables. One-half of your plate should be fruits and vegetables.  Include lean proteins, such as chicken, turkey, and fish. Lifestyle Each week, aim to do one of the following:  150 minutes of moderate exercise, such as walking.  75 minutes of vigorous exercise, such as running. General information  Know how many calories are in the foods you eat most often. This will help you calculate calorie counts faster.  Find a way of tracking calories that works for you. Get creative. Try different apps or programs if writing down calories does not work for you. What foods should I eat?  Eat nutritious foods. It is better to have a nutritious,  high-calorie food, such as an avocado, than a food with few nutrients, such as a bag of potato chips.  Use your calories on foods and drinks that will fill you up and will not leave you hungry soon after eating. ? Examples of foods that fill you up are nuts and nut butters, vegetables, lean proteins, and high-fiber foods such as whole grains. High-fiber foods are foods with more than 5 g of fiber per serving.  Pay attention to calories in drinks. Low-calorie drinks include water and unsweetened drinks. The items listed above may not be a complete list of foods and beverages you can eat.   Contact a dietitian for more information.   What foods should I limit? Limit foods or drinks that are not good sources of vitamins, minerals, or protein or that are high in unhealthy fats. These include:  Candy.  Other sweets.  Sodas, specialty coffee drinks, alcohol, and juice. The items listed above may not be a complete list of foods and beverages you should avoid. Contact a dietitian for more information. How do I count calories when eating out?  Pay attention to portions. Often, portions are much larger when eating out. Try these tips to keep portions smaller: ? Consider sharing a meal instead of getting your own. ? If you get your own meal, eat only half of it. Before you start eating, ask for a container and put half of your meal into it. ? When available, consider ordering smaller portions from the menu instead of full portions.  Pay attention to your food and drink choices. Knowing the way food is cooked and what is included with the meal can help you eat fewer calories. ? If calories are listed on the menu, choose the lower-calorie options. ? Choose dishes that include vegetables, fruits, whole grains, low-fat dairy products, and lean proteins. ? Choose items that are boiled, broiled, grilled, or steamed. Avoid items that are buttered, battered, fried, or served with cream sauce. Items labeled as  crispy are usually fried, unless stated otherwise. ? Choose water, low-fat milk, unsweetened iced tea, or other drinks without added sugar. If you want an alcoholic beverage, choose a lower-calorie option, such as a glass of wine or light beer. ? Ask for dressings, sauces, and syrups on the side. These are usually high in calories, so you should limit the amount you eat. ? If you want a salad, choose a garden salad and ask for grilled meats. Avoid extra toppings such as bacon, cheese, or fried items. Ask for the dressing on the side, or ask for olive oil and vinegar or lemon to use as dressing.  Estimate how many servings of a food you are given. Knowing serving sizes will help you be aware of how much food you are eating at restaurants. Where to find more information  Centers for Disease Control and Prevention: www.cdc.gov  [place] Department of Agriculture: myplate.gov Summary  Calorie counting means keeping track of how many calories you eat and drink each day. If you eat fewer calories than your body needs, you should lose weight.  A healthy amount of weight to lose per week is usually 1-2 lb (0.5-0.9 kg). This usually means reducing your daily calorie intake by 500-750 calories.  The number of calories in a food can be found on a Nutrition Facts label. If a food does not have a Nutrition Facts label, try to look up the calories online or ask your dietitian for help.  Use smaller plates, glasses, and bowls for smaller portions and to prevent overeating.  Use your calories on foods and drinks that will fill you up and not leave you hungry shortly after a meal. This information is not intended to replace advice given to you by your health care provider. Make sure you discuss any questions you have with your health care provider. Document Revised: 11/06/2019 Document Reviewed: 11/06/2019 [employer]vier Patient Education  2021 [employer]  

## 2020-11-15 ENCOUNTER — Other Ambulatory Visit: Payer: Self-pay

## 2020-11-15 MED ORDER — BD PEN NEEDLE NANO U/F 32G X 4 MM MISC: 3 refills | Status: DC

## 2020-11-18 ENCOUNTER — Ambulatory Visit: Payer: BC Managed Care – PPO | Admitting: Physician Assistant

## 2020-11-20 DIAGNOSIS — E119 Type 2 diabetes mellitus without complications: Secondary | ICD-10-CM | POA: Diagnosis not present

## 2020-11-20 DIAGNOSIS — H524 Presbyopia: Secondary | ICD-10-CM | POA: Diagnosis not present

## 2020-11-20 DIAGNOSIS — E089 Diabetes mellitus due to underlying condition without complications: Secondary | ICD-10-CM | POA: Diagnosis not present

## 2020-11-25 ENCOUNTER — Encounter: Payer: Self-pay | Admitting: Physician Assistant

## 2020-11-25 ENCOUNTER — Other Ambulatory Visit: Payer: Self-pay

## 2020-11-25 ENCOUNTER — Ambulatory Visit: Payer: BC Managed Care – PPO | Admitting: Physician Assistant

## 2020-11-25 DIAGNOSIS — E782 Mixed hyperlipidemia: Secondary | ICD-10-CM

## 2020-11-25 DIAGNOSIS — Z6832 Body mass index (BMI) 32.0-32.9, adult: Secondary | ICD-10-CM | POA: Diagnosis not present

## 2020-11-25 DIAGNOSIS — Z1231 Encounter for screening mammogram for malignant neoplasm of breast: Secondary | ICD-10-CM

## 2020-11-25 DIAGNOSIS — J4520 Mild intermittent asthma, uncomplicated: Secondary | ICD-10-CM

## 2020-11-25 DIAGNOSIS — I10 Essential (primary) hypertension: Secondary | ICD-10-CM | POA: Diagnosis not present

## 2020-11-25 DIAGNOSIS — E1165 Type 2 diabetes mellitus with hyperglycemia: Secondary | ICD-10-CM | POA: Diagnosis not present

## 2020-11-25 MED ORDER — DAPAGLIFLOZIN PROPANEDIOL 10 MG PO TABS: 10.0000 mg | ORAL_TABLET | Freq: Every day | ORAL | 2 refills | Status: DC

## 2020-11-25 MED ORDER — ROSUVASTATIN CALCIUM 5 MG PO TABS: 5.0000 mg | ORAL_TABLET | Freq: Every day | ORAL | 3 refills | Status: DC

## 2020-11-25 MED ORDER — AMLODIPINE BESYLATE 5 MG PO TABS: 5.0000 mg | ORAL_TABLET | Freq: Every day | ORAL | 2 refills | Status: DC

## 2020-11-25 NOTE — Progress Notes (Signed)
[facility] [address] [place], [place] [address]  Internal MEDICINE  Office Visit Note  Patient Name: Jordan Mccoy  [id number]  [id number]  Date of Service: 11/28/2020  Chief Complaint  Patient presents with  . Diabetes  . Weight Loss  . Referral    Needs for mammogram    HPI Pt returns today for f/u on diabetes and wt loss. -Pt has not been tracking what she is eating but is cutting back and has made changes. Eating more oatmeal and fruit. She has lost 4 pounds since last visit. -BG: 128 this morning. Has been doing well on jardiance but did have concerns about reading the S/E list. But did not have any S/E herself. She is open to continuing with victoza and jardiance. -BP at home is usually better, but does not have log with her today. It is usually in 130s-140 systolic. -Asthma well controlled. She uses advair everyday and albuterol as needed. -She also needs to be scheduled for a mammogram.  Current Medication: Outpatient Encounter Medications as of 11/25/2020  Medication Sig  . albuterol (VENTOLIN HFA) 108 (90 Base) MCG/ACT inhaler Inhale 2 puffs into the lungs every 6 (six) hours as needed for wheezing or shortness of breath.  Marland Kitchen amLODipine (NORVASC) 5 MG tablet Take 1 tablet (5 mg total) by mouth daily.  . BD PEN NEEDLE NANO U/F 32G X 4 MM MISC Use as directed with victoza  . bisoprolol-hydrochlorothiazide (ZIAC) 10-6.25 MG tablet Take 1 tablet by mouth daily.  . dapagliflozin propanediol (FARXIGA) 10 MG TABS tablet Take 1 tablet (10 mg total) by mouth daily.  . Fluticasone-Salmeterol (ADVAIR DISKUS) 250-50 MCG/DOSE AEPB INHALE 1 PUFF(S) TWICE A DAY FOR ASTHMA  . glucose blood (ONETOUCH VERIO) test strip Use as directed once a daily diag e11.65  . glucose blood test strip Blood sugar testing done once daily - E11.65  . hydrochlorothiazide (HYDRODIURIL) 12.5 MG tablet Take 1 tablet (12.5 mg total) by mouth daily.  . Lancets (ONETOUCH ULTRASOFT) lancets Blood  sugar testing QAM - E11.65  . liraglutide (VICTOZA) 18 MG/3ML SOPN Inject 1.8mg  Experiment once daily.  . [DISCONTINUED] rosuvastatin (CRESTOR) 5 MG tablet Take 1 tablet (5 mg total) by mouth daily.  . rosuvastatin (CRESTOR) 5 MG tablet Take 1 tablet (5 mg total) by mouth daily.   No facility-administered encounter medications on file as of 11/25/2020.    Surgical History: Past Surgical History:  Procedure Laterality Date  . ABDOMINAL HYSTERECTOMY    . CHOLECYSTECTOMY    . right foot surgery      Medical History: Past Medical History:  Diagnosis Date  . Diabetes mellitus without complication (HCC)   . Hypertension     Family History: Family History  Problem Relation Age of Onset  . Breast cancer Mother 62  . Breast cancer Maternal Aunt 63  . Cancer Father     Social History   Socioeconomic History  . Marital status: Single    Spouse name: Not on file  . Number of children: Not on file  . Years of education: Not on file  . Highest education level: Not on file  Occupational History  . Not on file  Tobacco Use  . Smoking status: Never Smoker  . Smokeless tobacco: Never Used  Vaping Use  . Vaping Use: Never used  Substance and Sexual Activity  . Alcohol use: Yes    Comment: social  . Drug use: No  . Sexual activity: Not on file  Other Topics  Concern  . Not on file  Social History Narrative  . Not on file   Social Determinants of Health   Financial Resource Strain: Not on file  Food Insecurity: Not on file  Transportation Needs: Not on file  Physical Activity: Not on file  Stress: Not on file  Social Connections: Not on file  Intimate Partner Violence: Not on file      Review of Systems  Constitutional: Negative for chills, fatigue and unexpected weight change.  HENT: Negative for congestion, postnasal drip, rhinorrhea, sneezing and sore throat.   Eyes: Negative for redness.  Respiratory: Negative for cough, chest tightness and shortness of breath.    Cardiovascular: Negative for chest pain and palpitations.  Gastrointestinal: Negative for abdominal pain, constipation, diarrhea, nausea and vomiting.  Endocrine: Negative for polydipsia, polyphagia and polyuria.  Genitourinary: Negative for dysuria and frequency.  Musculoskeletal: Negative for arthralgias, back pain, joint swelling and neck pain.  Skin: Negative for rash.  Neurological: Negative.  Negative for dizziness, tremors and numbness.  Hematological: Negative for adenopathy. Does not bruise/bleed easily.  Psychiatric/Behavioral: Negative for behavioral problems (Depression), sleep disturbance and suicidal ideas. The patient is not nervous/anxious.     Vital Signs: BP (!) 146/80   Pulse 75   Temp 98.3 F (36.8 C)   Resp 16   Ht 5\' 9"  (1.753 m)   Wt 219 lb 6.4 oz (99.5 kg)   SpO2 98%   BMI 32.40 kg/m    Physical Exam Constitutional:      General: She is not in acute distress.    Appearance: She is well-developed. She is obese. She is not diaphoretic.  HENT:     Head: Normocephalic and atraumatic.     Mouth/Throat:     Pharynx: No oropharyngeal exudate.  Eyes:     Pupils: Pupils are equal, round, and reactive to light.  Neck:     Thyroid: No thyromegaly.     Vascular: No JVD.     Trachea: No tracheal deviation.  Cardiovascular:     Rate and Rhythm: Normal rate and regular rhythm.     Heart sounds: Normal heart sounds. No murmur heard. No friction rub. No gallop.   Pulmonary:     Effort: Pulmonary effort is normal. No respiratory distress.     Breath sounds: No wheezing or rales.  Chest:     Chest wall: No tenderness.  Abdominal:     General: Bowel sounds are normal.     Palpations: Abdomen is soft.  Musculoskeletal:        General: Normal range of motion.     Cervical back: Normal range of motion and neck supple.  Lymphadenopathy:     Cervical: No cervical adenopathy.  Skin:    General: Skin is warm and dry.  Neurological:     Mental Status: She is  alert and oriented to person, place, and time.     Cranial Nerves: No cranial nerve deficit.  Psychiatric:        Behavior: Behavior normal.        Thought Content: Thought content normal.        Judgment: Judgment normal.        Assessment/Plan: 1. Type 2 diabetes mellitus with hyperglycemia, without long-term current use of insulin (HCC) Continue Victoza, Jardiance not covered by insurance so will start on Farxiga instead. - dapagliflozin propanediol (FARXIGA) 10 MG TABS tablet; Take 1 tablet (10 mg total) by mouth daily.  Dispense: 30 tablet; Refill: 2  2.  Essential hypertension Continue Norvasc. Pt will bring in log of BP next visit. Educated on the importance of monitoring and maintaining good BP control. May need to double Norvasc to 10mg  if log shows BP is elevated at home too. - amLODipine (NORVASC) 5 MG tablet; Take 1 tablet (5 mg total) by mouth daily.  Dispense: 30 tablet; Refill: 2  3. BMI 32.0-32.9,adult Jardiance not covered by insurance, will start on Farxiga instead. - dapagliflozin propanediol (FARXIGA) 10 MG TABS tablet; Take 1 tablet (10 mg total) by mouth daily.  Dispense: 30 tablet; Refill: 2  Obesity Counseling: Risk Assessment: An assessment of behavioral risk factors was made today and includes lack of exercise sedentary lifestyle, lack of portion control and poor dietary habits.  Risk Modification Advice: She was counseled on portion control guidelines. Restricting daily caloric intake to 1500. The detrimental long term effects of obesity on her health and ongoing poor compliance was also discussed with the patient.  4. Mild intermittent asthma without complication Continue inhalers as prescribed.  5. Mixed hyperlipidemia Will start on statin. Also educated about working on diet and exercise. - rosuvastatin (CRESTOR) 5 MG tablet; Take 1 tablet (5 mg total) by mouth daily.  Dispense: 30 tablet; Refill: 3  6. Encounter for screening mammogram for breast  cancer - MM DIGITAL SCREENING BILATERAL; Future  General Counseling: rim thatch understanding of the findings of todays visit and agrees with plan of treatment. I have discussed any further diagnostic evaluation that may be needed or ordered today. We also reviewed her medications today. she has been encouraged to call the office with any questions or concerns that should arise related to todays visit.    Orders Placed This Encounter  Procedures  . MM DIGITAL SCREENING BILATERAL    Meds ordered this encounter  Medications  . dapagliflozin propanediol (FARXIGA) 10 MG TABS tablet    Sig: Take 1 tablet (10 mg total) by mouth daily.    Dispense:  30 tablet    Refill:  2  . amLODipine (NORVASC) 5 MG tablet    Sig: Take 1 tablet (5 mg total) by mouth daily.    Dispense:  30 tablet    Refill:  2  . rosuvastatin (CRESTOR) 5 MG tablet    Sig: Take 1 tablet (5 mg total) by mouth daily.    Dispense:  30 tablet    Refill:  3    Total time spent:30 Minutes Time spent includes review of chart, medications, test results, and follow up plan with the patient.      Dr Nancy Nordmann Internal medicine

## 2020-12-10 ENCOUNTER — Other Ambulatory Visit: Payer: Self-pay | Admitting: Physician Assistant

## 2020-12-10 DIAGNOSIS — Z1231 Encounter for screening mammogram for malignant neoplasm of breast: Secondary | ICD-10-CM

## 2020-12-23 ENCOUNTER — Ambulatory Visit: Payer: BC Managed Care – PPO | Admitting: Physician Assistant

## 2020-12-23 ENCOUNTER — Encounter: Payer: Self-pay | Admitting: Physician Assistant

## 2020-12-23 ENCOUNTER — Other Ambulatory Visit: Payer: Self-pay

## 2020-12-23 DIAGNOSIS — E1165 Type 2 diabetes mellitus with hyperglycemia: Secondary | ICD-10-CM | POA: Diagnosis not present

## 2020-12-23 DIAGNOSIS — I10 Essential (primary) hypertension: Secondary | ICD-10-CM | POA: Diagnosis not present

## 2020-12-23 DIAGNOSIS — J4520 Mild intermittent asthma, uncomplicated: Secondary | ICD-10-CM | POA: Diagnosis not present

## 2020-12-23 DIAGNOSIS — E782 Mixed hyperlipidemia: Secondary | ICD-10-CM | POA: Diagnosis not present

## 2020-12-23 DIAGNOSIS — Z6832 Body mass index (BMI) 32.0-32.9, adult: Secondary | ICD-10-CM | POA: Diagnosis not present

## 2020-12-23 NOTE — Progress Notes (Signed)
[facility] [address] [place], [place] [address]  Internal MEDICINE  Office Visit Note  Patient Name: Jordan Mccoy  [id number]  [id number]  Date of Service: 12/28/2020  Chief Complaint  Patient presents with  . Diabetes  . Hypertension    HPI Pt is here for f/u on DM, HTN, and wt loss. -She is trying to walk more. At breakfast she is trying to cut bacon out and focus on fruits. She has lost 2 pounds since last visit. She is drinking less soda but still having some. Trying to increase water intake -Does not check sugar consistently, but will check some mornings and was 128 the other morning. Continued on Victoza and started on Farxiga and is doing well on these. -Advair BID for asthma, has not had to use rescue inhaler in a long time. Only needs it usually when it is very warm out. -Mammogram is next Friday -Taking crestor daily and tolerating it well. -BP log: at home ranges 118-136/80s on the 5mg  Norvasc.  Current Medication: Outpatient Encounter Medications as of 12/23/2020  Medication Sig  . albuterol (VENTOLIN HFA) 108 (90 Base) MCG/ACT inhaler Inhale 2 puffs into the lungs every 6 (six) hours as needed for wheezing or shortness of breath.  12/25/2020 amLODipine (NORVASC) 5 MG tablet Take 1 tablet (5 mg total) by mouth daily.  . BD PEN NEEDLE NANO U/F 32G X 4 MM MISC Use as directed with victoza  . bisoprolol-hydrochlorothiazide (ZIAC) 10-6.25 MG tablet Take 1 tablet by mouth daily.  . dapagliflozin propanediol (FARXIGA) 10 MG TABS tablet Take 1 tablet (10 mg total) by mouth daily.  . Fluticasone-Salmeterol (ADVAIR DISKUS) 250-50 MCG/DOSE AEPB INHALE 1 PUFF(S) TWICE A DAY FOR ASTHMA  . glucose blood (ONETOUCH VERIO) test strip Use as directed once a daily diag e11.65  . glucose blood test strip Blood sugar testing done once daily - E11.65  . hydrochlorothiazide (HYDRODIURIL) 12.5 MG tablet Take 1 tablet (12.5 mg total) by mouth daily.  . Lancets (ONETOUCH ULTRASOFT)  lancets Blood sugar testing QAM - E11.65  . liraglutide (VICTOZA) 18 MG/3ML SOPN Inject 1.8mg  Dooms once daily.  . rosuvastatin (CRESTOR) 5 MG tablet Take 1 tablet (5 mg total) by mouth daily.   No facility-administered encounter medications on file as of 12/23/2020.    Surgical History: Past Surgical History:  Procedure Laterality Date  . ABDOMINAL HYSTERECTOMY    . CHOLECYSTECTOMY    . right foot surgery      Medical History: Past Medical History:  Diagnosis Date  . Diabetes mellitus without complication (HCC)   . Hypertension     Family History: Family History  Problem Relation Age of Onset  . Breast cancer Mother 23  . Breast cancer Maternal Aunt 63  . Cancer Father     Social History   Socioeconomic History  . Marital status: Single    Spouse name: Not on file  . Number of children: Not on file  . Years of education: Not on file  . Highest education level: Not on file  Occupational History  . Not on file  Tobacco Use  . Smoking status: Never Smoker  . Smokeless tobacco: Never Used  Vaping Use  . Vaping Use: Never used  Substance and Sexual Activity  . Alcohol use: Yes    Comment: social  . Drug use: No  . Sexual activity: Not on file  Other Topics Concern  . Not on file  Social History Narrative  . Not on file  Social Determinants of Health   Financial Resource Strain: Not on file  Food Insecurity: Not on file  Transportation Needs: Not on file  Physical Activity: Not on file  Stress: Not on file  Social Connections: Not on file  Intimate Partner Violence: Not on file      Review of Systems  Constitutional: Negative for chills, fatigue and unexpected weight change.  HENT: Negative for congestion, postnasal drip, rhinorrhea, sneezing and sore throat.   Eyes: Negative for redness.  Respiratory: Negative for cough, chest tightness and shortness of breath.   Cardiovascular: Negative for chest pain and palpitations.  Gastrointestinal: Negative  for abdominal pain, constipation, diarrhea, nausea and vomiting.  Genitourinary: Negative for dysuria and frequency.  Musculoskeletal: Negative for arthralgias, back pain, joint swelling and neck pain.  Skin: Negative for rash.  Neurological: Negative.  Negative for tremors and numbness.  Hematological: Negative for adenopathy. Does not bruise/bleed easily.  Psychiatric/Behavioral: Negative for behavioral problems (Depression), sleep disturbance and suicidal ideas. The patient is not nervous/anxious.     Vital Signs: BP 119/75   Pulse 82   Temp 98.5 F (36.9 C)   Resp 16   Ht 5\' 9"  (1.753 m)   Wt 217 lb 6.4 oz (98.6 kg)   SpO2 96%   BMI 32.10 kg/m    Physical Exam Constitutional:      General: She is not in acute distress.    Appearance: She is well-developed. She is obese. She is not diaphoretic.  HENT:     Head: Normocephalic and atraumatic.     Mouth/Throat:     Pharynx: No oropharyngeal exudate.  Eyes:     Pupils: Pupils are equal, round, and reactive to light.  Neck:     Thyroid: No thyromegaly.     Vascular: No JVD.     Trachea: No tracheal deviation.  Cardiovascular:     Rate and Rhythm: Normal rate and regular rhythm.     Heart sounds: Normal heart sounds. No murmur heard. No friction rub. No gallop.   Pulmonary:     Effort: Pulmonary effort is normal. No respiratory distress.     Breath sounds: No wheezing or rales.  Chest:     Chest wall: No tenderness.  Abdominal:     General: Bowel sounds are normal.     Palpations: Abdomen is soft.  Musculoskeletal:        General: Normal range of motion.     Cervical back: Normal range of motion and neck supple.  Lymphadenopathy:     Cervical: No cervical adenopathy.  Skin:    General: Skin is warm and dry.  Neurological:     Mental Status: She is alert and oriented to person, place, and time.     Cranial Nerves: No cranial nerve deficit.  Psychiatric:        Behavior: Behavior normal.        Thought Content:  Thought content normal.        Judgment: Judgment normal.        Assessment/Plan: 1. Type 2 diabetes mellitus with hyperglycemia, without long-term current use of insulin (HCC) Continue Victoza and Farxiga as prescribed. Will check A1c next visit. Encouraged to continue monitoring at home more regularly.  2. Essential hypertension BP well controlled, continue 5mg  Norvasc.  3. Mild intermittent asthma without complication Stable, continue inhalers as indicated.  4. Mixed hyperlipidemia Continue Crestor daily.  5. BMI 32.0-32.9,adult Continue increasing exercise and monitoring food intake. Goal to lose 4lbs by next  visit. Obesity Counseling: Risk Assessment: An assessment of behavioral risk factors was made today and includes lack of exercise sedentary lifestyle, lack of portion control and poor dietary habits.  Risk Modification Advice: She was counseled on portion control guidelines. Restricting daily caloric intake to 1500. The detrimental long term effects of obesity on her health and ongoing poor compliance was also discussed with the patient.   General Counseling: chelcey caputo understanding of the findings of todays visit and agrees with plan of treatment. I have discussed any further diagnostic evaluation that may be needed or ordered today. We also reviewed her medications today. she has been encouraged to call the office with any questions or concerns that should arise related to todays visit.    No orders of the defined types were placed in this encounter.   No orders of the defined types were placed in this encounter.   This patient was seen by Lynn Ito, PA-C in collaboration with Dr. Beverely Risen as a part of collaborative care agreement.   Total time spent:30 Minutes Time spent includes review of chart, medications, test results, and follow up plan with the patient.      Dr Lyndon Code Internal medicine

## 2020-12-26 NOTE — Patient Instructions (Signed)

## 2020-12-31 ENCOUNTER — Other Ambulatory Visit: Payer: Self-pay

## 2020-12-31 ENCOUNTER — Ambulatory Visit
Admission: RE | Admit: 2020-12-31 | Discharge: 2020-12-31 | Disposition: A | Discharge status: Home / Self Care | Payer: BC Managed Care – PPO | Source: Ambulatory Visit | Attending: Physician Assistant | Admitting: Physician Assistant

## 2020-12-31 DIAGNOSIS — Z1231 Encounter for screening mammogram for malignant neoplasm of breast: Secondary | ICD-10-CM | POA: Diagnosis not present

## 2021-01-30 ENCOUNTER — Other Ambulatory Visit: Payer: Self-pay | Admitting: Internal Medicine

## 2021-01-30 DIAGNOSIS — J4520 Mild intermittent asthma, uncomplicated: Secondary | ICD-10-CM

## 2021-01-30 DIAGNOSIS — R062 Wheezing: Secondary | ICD-10-CM

## 2021-02-04 ENCOUNTER — Other Ambulatory Visit: Payer: Self-pay | Admitting: Nurse Practitioner

## 2021-02-04 ENCOUNTER — Other Ambulatory Visit: Payer: Self-pay | Admitting: Physician Assistant

## 2021-02-04 DIAGNOSIS — E1165 Type 2 diabetes mellitus with hyperglycemia: Secondary | ICD-10-CM

## 2021-02-21 ENCOUNTER — Other Ambulatory Visit: Payer: Self-pay | Admitting: Physician Assistant

## 2021-02-21 DIAGNOSIS — I10 Essential (primary) hypertension: Secondary | ICD-10-CM

## 2021-02-21 DIAGNOSIS — E782 Mixed hyperlipidemia: Secondary | ICD-10-CM

## 2021-02-27 ENCOUNTER — Other Ambulatory Visit: Payer: Self-pay | Admitting: Physician Assistant

## 2021-02-27 DIAGNOSIS — E1165 Type 2 diabetes mellitus with hyperglycemia: Secondary | ICD-10-CM

## 2021-02-27 DIAGNOSIS — Z6832 Body mass index (BMI) 32.0-32.9, adult: Secondary | ICD-10-CM

## 2021-03-22 ENCOUNTER — Encounter: Payer: BC Managed Care – PPO | Admitting: Nurse Practitioner

## 2021-03-31 ENCOUNTER — Encounter: Payer: BC Managed Care – PPO | Admitting: Physician Assistant

## 2021-04-19 ENCOUNTER — Telehealth: Payer: Self-pay

## 2021-04-19 NOTE — Telephone Encounter (Signed)
Left vm for patient to call office to schedule physical-Toni

## 2021-05-20 ENCOUNTER — Encounter: Payer: Self-pay | Admitting: Physician Assistant

## 2021-05-20 ENCOUNTER — Ambulatory Visit (INDEPENDENT_AMBULATORY_CARE_PROVIDER_SITE_OTHER): Payer: BC Managed Care – PPO | Admitting: Physician Assistant

## 2021-05-20 ENCOUNTER — Other Ambulatory Visit: Payer: Self-pay

## 2021-05-20 DIAGNOSIS — J4520 Mild intermittent asthma, uncomplicated: Secondary | ICD-10-CM

## 2021-05-20 DIAGNOSIS — R300 Dysuria: Secondary | ICD-10-CM | POA: Diagnosis not present

## 2021-05-20 DIAGNOSIS — Z124 Encounter for screening for malignant neoplasm of cervix: Secondary | ICD-10-CM | POA: Diagnosis not present

## 2021-05-20 DIAGNOSIS — E669 Obesity, unspecified: Secondary | ICD-10-CM

## 2021-05-20 DIAGNOSIS — Z0001 Encounter for general adult medical examination with abnormal findings: Secondary | ICD-10-CM

## 2021-05-20 DIAGNOSIS — E782 Mixed hyperlipidemia: Secondary | ICD-10-CM | POA: Diagnosis not present

## 2021-05-20 DIAGNOSIS — Z113 Encounter for screening for infections with a predominantly sexual mode of transmission: Secondary | ICD-10-CM

## 2021-05-20 DIAGNOSIS — E1165 Type 2 diabetes mellitus with hyperglycemia: Secondary | ICD-10-CM | POA: Diagnosis not present

## 2021-05-20 DIAGNOSIS — I10 Essential (primary) hypertension: Secondary | ICD-10-CM

## 2021-05-20 DIAGNOSIS — R5383 Other fatigue: Secondary | ICD-10-CM

## 2021-05-20 LAB — POCT GLYCOSYLATED HEMOGLOBIN (HGB A1C): Hemoglobin A1C: 6.3 % — AB (ref 4.0–5.6)

## 2021-05-20 MED ORDER — DAPAGLIFLOZIN PROPANEDIOL 10 MG PO TABS: 10.0000 mg | ORAL_TABLET | Freq: Every day | ORAL | 2 refills | Status: DC

## 2021-05-20 MED ORDER — BD PEN NEEDLE NANO U/F 32G X 4 MM MISC: 3 refills | Status: DC

## 2021-05-20 NOTE — Progress Notes (Signed)
[facility] [address] [place], [place] [address]  Internal MEDICINE  Office Visit Note  Patient Name: Jordan Mccoy  [id number]  [id number]  Date of Service: 05/20/2021  Chief Complaint  Patient presents with   Annual Exam    With pap   Quality Metric Gaps    Pneumonia vacc, HIV screen, Tdap, shingrix vacc, Foot exam, flu vacc      HPI Pt is here for routine health maintenance examination and has no complaints today -Fasting BG have been 120s and has been taking her victoza and farxiga without problems.  -BP at home has been around 120-130/80s -Tolerated crestor and has been taking it in the Am without issue -Eye exam done already and was normal -using advair,but has not needed to use albuterol much -Due for pap today, mammogram and cologuard UTD -Due for routine fasting labs  Current Medication: Outpatient Encounter Medications as of 05/20/2021  Medication Sig   amLODipine (NORVASC) 5 MG tablet TAKE 1 TABLET (5 MG TOTAL) BY MOUTH DAILY.   BD PEN NEEDLE NANO U/F 32G X 4 MM MISC Use as directed with victoza   bisoprolol-hydrochlorothiazide (ZIAC) 10-6.25 MG tablet Take 1 tablet by mouth daily.   dapagliflozin propanediol (FARXIGA) 10 MG TABS tablet Take 1 tablet (10 mg total) by mouth daily.   Fluticasone-Salmeterol (ADVAIR DISKUS) 250-50 MCG/DOSE AEPB INHALE 1 PUFF(S) TWICE A DAY FOR ASTHMA   glucose blood (ONETOUCH VERIO) test strip Use as directed once a daily diag e11.65   glucose blood test strip Blood sugar testing done once daily - E11.65   hydrochlorothiazide (HYDRODIURIL) 12.5 MG tablet Take 1 tablet (12.5 mg total) by mouth daily.   Lancets (ONETOUCH ULTRASOFT) lancets Blood sugar testing QAM - E11.65   liraglutide (VICTOZA) 18 MG/3ML SOPN INJECT 1.8 MG UNDER THE SKIN ONCE DAILY   rosuvastatin (CRESTOR) 5 MG tablet TAKE 1 TABLET (5 MG TOTAL) BY MOUTH DAILY.   VENTOLIN HFA 108 (90 Base) MCG/ACT inhaler TAKE 2 PUFFS BY MOUTH EVERY 6 HOURS AS NEEDED  FOR WHEEZE OR SHORTNESS OF BREATH   [DISCONTINUED] BD PEN NEEDLE NANO U/F 32G X 4 MM MISC Use as directed with victoza   [DISCONTINUED] FARXIGA 10 MG TABS tablet TAKE 1 TABLET BY MOUTH EVERY DAY   No facility-administered encounter medications on file as of 05/20/2021.    Surgical History: Past Surgical History:  Procedure Laterality Date   ABDOMINAL HYSTERECTOMY     CHOLECYSTECTOMY     right foot surgery      Medical History: Past Medical History:  Diagnosis Date   Diabetes mellitus without complication (HCC)    Hypertension     Family History: Family History  Problem Relation Age of Onset   Breast cancer Mother 45   Breast cancer Maternal Aunt 97   Cancer Father       Review of Systems  Constitutional:  Negative for chills, fatigue and unexpected weight change.  HENT:  Negative for congestion, postnasal drip, rhinorrhea, sneezing and sore throat.   Eyes:  Negative for redness.  Respiratory:  Negative for cough, chest tightness and shortness of breath.   Cardiovascular:  Negative for chest pain and palpitations.  Gastrointestinal:  Negative for abdominal pain, constipation, diarrhea, nausea and vomiting.  Genitourinary:  Negative for dysuria and frequency.  Musculoskeletal:  Negative for arthralgias, back pain, joint swelling and neck pain.  Skin:  Negative for rash.  Neurological: Negative.  Negative for tremors and numbness.  Hematological:  Negative for adenopathy. Does  not bruise/bleed easily.  Psychiatric/Behavioral:  Negative for behavioral problems (Depression), sleep disturbance and suicidal ideas. The patient is not nervous/anxious.     Vital Signs: BP 130/67   Pulse 79   Temp 98.5 F (36.9 C)   Resp 16   Ht 5\' 9"  (1.753 m)   Wt 216 lb 3.2 oz (98.1 kg)   SpO2 98%   BMI 31.93 kg/m    Physical Exam Vitals and nursing note reviewed.  Constitutional:      General: She is not in acute distress.    Appearance: She is well-developed. She is obese. She  is not diaphoretic.  HENT:     Head: Normocephalic and atraumatic.     Mouth/Throat:     Pharynx: No oropharyngeal exudate.  Eyes:     Pupils: Pupils are equal, round, and reactive to light.  Neck:     Thyroid: No thyromegaly.     Vascular: No JVD.     Trachea: No tracheal deviation.  Cardiovascular:     Rate and Rhythm: Normal rate and regular rhythm.     Pulses:          Dorsalis pedis pulses are 3+ on the right side and 3+ on the left side.       Posterior tibial pulses are 3+ on the right side and 3+ on the left side.     Heart sounds: Normal heart sounds. No murmur heard.   No friction rub. No gallop.  Pulmonary:     Effort: Pulmonary effort is normal. No respiratory distress.     Breath sounds: No wheezing or rales.  Chest:     Chest wall: No tenderness.  Abdominal:     General: Bowel sounds are normal.     Palpations: Abdomen is soft.  Genitourinary:    Exam position: Lithotomy position.     Vagina: Vaginal discharge present.     Comments: Pap performed Musculoskeletal:        General: Normal range of motion.     Cervical back: Normal range of motion and neck supple.     Right foot: Normal range of motion.     Left foot: Normal range of motion.  Feet:     Right foot:     Protective Sensation: 2 sites tested.  2 sites sensed.     Skin integrity: Skin integrity normal.     Toenail Condition: Right toenails are normal.     Left foot:     Protective Sensation: 2 sites tested.  2 sites sensed.     Skin integrity: Skin integrity normal.     Toenail Condition: Left toenails are normal.  Lymphadenopathy:     Cervical: No cervical adenopathy.  Skin:    General: Skin is warm and dry.  Neurological:     Mental Status: She is alert and oriented to person, place, and time.     Cranial Nerves: No cranial nerve deficit.  Psychiatric:        Behavior: Behavior normal.        Thought Content: Thought content normal.        Judgment: Judgment normal.     LABS: Recent  Results (from the past 2160 hour(s))  POCT glycosylated hemoglobin (Hb A1C)     Status: Abnormal   Collection Time: 05/20/21 11:04 AM  Result Value Ref Range   Hemoglobin A1C 6.3 (A) 4.0 - 5.6 %   HbA1c POC (<> result, manual entry)     HbA1c, POC (prediabetic range)  HbA1c, POC (controlled diabetic range)          Assessment/Plan: 1. Encounter for general adult medical examination with abnormal findings CPE performed, pap done today, UTD on colon cancer screening and mammogram. Fasting labs ordered  2. Type 2 diabetes mellitus with hyperglycemia, without long-term current use of insulin (HCC) - POCT glycosylated hemoglobin (Hb A1C) is 6.3 today and stable--will continue victoza and farxiga and continue to work on diet and exercise - BD PEN NEEDLE NANO U/F 32G X 4 MM MISC; Use as directed with victoza  Dispense: 100 each; Refill: 3 - dapagliflozin propanediol (FARXIGA) 10 MG TABS tablet; Take 1 tablet (10 mg total) by mouth daily.  Dispense: 30 tablet; Refill: 2  3. Essential hypertension Stable, continue current medications  4. Mixed hyperlipidemia Continue crestor, will update labs - Lipid Panel With LDL/HDL Ratio  5. Mild intermittent asthma without complication Stable, continue inhalers as indicated  6. Routine cervical smear - IGP, Aptima HPV  7. Screening examination for STD (sexually transmitted disease) - NuSwab Vaginitis Plus (VG+)  8. Obesity (BMI 30.0-34.9) - dapagliflozin propanediol (FARXIGA) 10 MG TABS tablet; Take 1 tablet (10 mg total) by mouth daily.  Dispense: 30 tablet; Refill: 2 Obesity Counseling: Risk Assessment: An assessment of behavioral risk factors was made today and includes lack of exercise sedentary lifestyle, lack of portion control and poor dietary habits.  Risk Modification Advice: She was counseled on portion control guidelines. Restricting daily caloric intake to 1500. The detrimental long term effects of obesity on her health and  ongoing poor compliance was also discussed with the patient.    9. Other fatigue - CBC w/Diff/Platelet - Comprehensive metabolic panel - TSH + free T4 - VITAMIN D 25 Hydroxy (Vit-D Deficiency, Fractures)  10. Dysuria - UA/M w/rflx Culture, Routine     General Counseling: Undra verbalizes understanding of the findings of todays visit and agrees with plan of treatment. I have discussed any further diagnostic evaluation that may be needed or ordered today. We also reviewed her medications today. she has been encouraged to call the office with any questions or concerns that should arise related to todays visit.    Counseling:    Orders Placed This Encounter  Procedures   UA/M w/rflx Culture, Routine   CBC w/Diff/Platelet   Comprehensive metabolic panel   TSH + free T4   Lipid Panel With LDL/HDL Ratio   VITAMIN D 25 Hydroxy (Vit-D Deficiency, Fractures)   NuSwab Vaginitis Plus (VG+)   POCT glycosylated hemoglobin (Hb A1C)     Meds ordered this encounter  Medications   BD PEN NEEDLE NANO U/F 32G X 4 MM MISC    Sig: Use as directed with victoza    Dispense:  100 each    Refill:  3    Please fill ultra fine Bd pen needle   dapagliflozin propanediol (FARXIGA) 10 MG TABS tablet    Sig: Take 1 tablet (10 mg total) by mouth daily.    Dispense:  30 tablet    Refill:  2     This patient was seen by Lynn Ito, PA-C in collaboration with Dr. Beverely Risen as a part of collaborative care agreement.  Total time spent:40 Minutes  Time spent includes review of chart, medications, test results, and follow up plan with the patient.     Lyndon Code, MD  Internal Medicine

## 2021-05-21 LAB — UA/M W/RFLX CULTURE, ROUTINE
Bilirubin, UA: NEGATIVE
Ketones, UA: NEGATIVE
Leukocytes,UA: NEGATIVE
Nitrite, UA: NEGATIVE
Protein,UA: NEGATIVE
RBC, UA: NEGATIVE
Specific Gravity, UA: 1.021 (ref 1.005–1.030)
Urobilinogen, Ur: 0.2 mg/dL (ref 0.2–1.0)
pH, UA: 5.5 (ref 5.0–7.5)

## 2021-05-21 LAB — MICROSCOPIC EXAMINATION
Casts: NONE SEEN /lpf
RBC, Urine: NONE SEEN /hpf (ref 0–2)
WBC, UA: NONE SEEN /hpf (ref 0–5)

## 2021-05-23 LAB — NUSWAB VAGINITIS PLUS (VG+)
Atopobium vaginae: HIGH Score — AB
BVAB 2: HIGH Score — AB
Candida albicans, NAA: NEGATIVE
Candida glabrata, NAA: NEGATIVE
Chlamydia trachomatis, NAA: NEGATIVE
Megasphaera 1: HIGH Score — AB
Neisseria gonorrhoeae, NAA: NEGATIVE
Trich vag by NAA: NEGATIVE

## 2021-05-24 LAB — IGP, APTIMA HPV: HPV Aptima: NEGATIVE

## 2021-05-31 ENCOUNTER — Other Ambulatory Visit: Payer: Self-pay | Admitting: Physician Assistant

## 2021-05-31 DIAGNOSIS — N760 Acute vaginitis: Secondary | ICD-10-CM

## 2021-05-31 DIAGNOSIS — B9689 Other specified bacterial agents as the cause of diseases classified elsewhere: Secondary | ICD-10-CM

## 2021-05-31 MED ORDER — METRONIDAZOLE 500 MG PO TABS: 500.0000 mg | ORAL_TABLET | Freq: Two times a day (BID) | ORAL | 0 refills | Status: DC

## 2021-06-02 ENCOUNTER — Telehealth: Payer: Self-pay

## 2021-06-02 NOTE — Telephone Encounter (Signed)
-----   Message from Carlean Jews, PA-C sent at 05/31/2021  1:58 PM EDT ----- Please let patient know that her pap was normal, but she does have BV and I am sending metronidazole for this.  She should not consume alcohol while on this medication.

## 2021-06-02 NOTE — Telephone Encounter (Signed)
Spoke to pt and informed her of her pap results and that we sent medication for BV to her pharmacy.

## 2021-06-09 ENCOUNTER — Other Ambulatory Visit: Payer: Self-pay | Admitting: Physician Assistant

## 2021-06-09 DIAGNOSIS — E1165 Type 2 diabetes mellitus with hyperglycemia: Secondary | ICD-10-CM

## 2021-07-08 ENCOUNTER — Other Ambulatory Visit: Payer: Self-pay | Admitting: Internal Medicine

## 2021-07-12 ENCOUNTER — Other Ambulatory Visit: Payer: Self-pay | Admitting: Internal Medicine

## 2021-07-12 DIAGNOSIS — J4520 Mild intermittent asthma, uncomplicated: Secondary | ICD-10-CM

## 2021-07-12 DIAGNOSIS — R062 Wheezing: Secondary | ICD-10-CM

## 2021-07-15 DIAGNOSIS — M25532 Pain in left wrist: Secondary | ICD-10-CM | POA: Diagnosis not present

## 2021-07-15 DIAGNOSIS — S60212A Contusion of left wrist, initial encounter: Secondary | ICD-10-CM | POA: Diagnosis not present

## 2021-08-07 ENCOUNTER — Telehealth: Payer: Self-pay

## 2021-08-07 DIAGNOSIS — E1165 Type 2 diabetes mellitus with hyperglycemia: Secondary | ICD-10-CM

## 2021-08-07 MED ORDER — VICTOZA 18 MG/3ML ~~LOC~~ SOPN: PEN_INJECTOR | SUBCUTANEOUS | 3 refills | Status: DC

## 2021-08-07 NOTE — Telephone Encounter (Signed)
PA sent for VICTOZA 18mg /3 ml on 08/07/21 @ 655 pm  PA came back approved and valid from 08/07/21 to 08/06/22 Sent prescription to pharmacy

## 2021-08-19 ENCOUNTER — Ambulatory Visit: Payer: BC Managed Care – PPO | Admitting: Physician Assistant

## 2021-08-19 ENCOUNTER — Other Ambulatory Visit: Payer: Self-pay | Admitting: Physician Assistant

## 2021-08-19 DIAGNOSIS — E669 Obesity, unspecified: Secondary | ICD-10-CM

## 2021-08-19 DIAGNOSIS — E1165 Type 2 diabetes mellitus with hyperglycemia: Secondary | ICD-10-CM

## 2021-08-21 ENCOUNTER — Other Ambulatory Visit: Payer: Self-pay | Admitting: Physician Assistant

## 2021-08-21 DIAGNOSIS — I10 Essential (primary) hypertension: Secondary | ICD-10-CM

## 2021-08-21 DIAGNOSIS — E782 Mixed hyperlipidemia: Secondary | ICD-10-CM

## 2021-09-02 DIAGNOSIS — E559 Vitamin D deficiency, unspecified: Secondary | ICD-10-CM | POA: Diagnosis not present

## 2021-09-02 DIAGNOSIS — R5383 Other fatigue: Secondary | ICD-10-CM | POA: Diagnosis not present

## 2021-09-03 LAB — COMPREHENSIVE METABOLIC PANEL
ALT: 27 IU/L (ref 0–32)
AST: 18 IU/L (ref 0–40)
Albumin/Globulin Ratio: 1.5 (ref 1.2–2.2)
Albumin: 4.1 g/dL (ref 3.8–4.9)
Alkaline Phosphatase: 66 IU/L (ref 44–121)
BUN/Creatinine Ratio: 16 (ref 9–23)
BUN: 15 mg/dL (ref 6–24)
Bilirubin Total: 0.2 mg/dL (ref 0.0–1.2)
CO2: 24 mmol/L (ref 20–29)
Calcium: 9.4 mg/dL (ref 8.7–10.2)
Chloride: 105 mmol/L (ref 96–106)
Creatinine, Ser: 0.91 mg/dL (ref 0.57–1.00)
Globulin, Total: 2.7 g/dL (ref 1.5–4.5)
Glucose: 118 mg/dL — ABNORMAL HIGH (ref 70–99)
Potassium: 4 mmol/L (ref 3.5–5.2)
Sodium: 143 mmol/L (ref 134–144)
Total Protein: 6.8 g/dL (ref 6.0–8.5)
eGFR: 76 mL/min/{1.73_m2} (ref >59)

## 2021-09-03 LAB — CBC WITH DIFFERENTIAL/PLATELET
Basophils Absolute: 0.1 10*3/uL (ref 0.0–0.2)
Basos: 1 %
EOS (ABSOLUTE): 0.3 10*3/uL (ref 0.0–0.4)
Eos: 3 %
Hematocrit: 40.8 % (ref 34.0–46.6)
Hemoglobin: 13.4 g/dL (ref 11.1–15.9)
Immature Grans (Abs): 0 10*3/uL (ref 0.0–0.1)
Immature Granulocytes: 0 %
Lymphocytes Absolute: 2.8 10*3/uL (ref 0.7–3.1)
Lymphs: 30 %
MCH: 26.1 pg — ABNORMAL LOW (ref 26.6–33.0)
MCHC: 32.8 g/dL (ref 31.5–35.7)
MCV: 79 fL (ref 79–97)
Monocytes Absolute: 0.5 10*3/uL (ref 0.1–0.9)
Monocytes: 6 %
Neutrophils Absolute: 5.5 10*3/uL (ref 1.4–7.0)
Neutrophils: 60 %
Platelets: 212 10*3/uL (ref 150–450)
RBC: 5.14 x10E6/uL (ref 3.77–5.28)
RDW: 13.1 % (ref 11.7–15.4)
WBC: 9.1 10*3/uL (ref 3.4–10.8)

## 2021-09-03 LAB — TSH+FREE T4
Free T4: 1.23 ng/dL (ref 0.82–1.77)
TSH: 1.04 u[IU]/mL (ref 0.450–4.500)

## 2021-09-03 LAB — VITAMIN D 25 HYDROXY (VIT D DEFICIENCY, FRACTURES): Vit D, 25-Hydroxy: 19.9 ng/mL — ABNORMAL LOW (ref 30.0–100.0)

## 2021-09-03 LAB — LIPID PANEL WITH LDL/HDL RATIO
Cholesterol, Total: 151 mg/dL (ref 100–199)
HDL: 40 mg/dL (ref >39)
LDL Chol Calc (NIH): 84 mg/dL (ref 0–99)
LDL/HDL Ratio: 2.1 ratio (ref 0.0–3.2)
Triglycerides: 152 mg/dL — ABNORMAL HIGH (ref 0–149)
VLDL Cholesterol Cal: 27 mg/dL (ref 5–40)

## 2021-09-05 ENCOUNTER — Telehealth: Payer: Self-pay

## 2021-09-05 NOTE — Telephone Encounter (Signed)
-----   Message from Carlean Jews, PA-C sent at 09/04/2021  5:21 PM EST ----- Labs reviewed and overall look good, Vitamin D is low and should supplement OTC

## 2021-09-30 ENCOUNTER — Other Ambulatory Visit: Payer: Self-pay | Admitting: Physician Assistant

## 2021-09-30 DIAGNOSIS — E1165 Type 2 diabetes mellitus with hyperglycemia: Secondary | ICD-10-CM

## 2021-11-20 ENCOUNTER — Other Ambulatory Visit: Payer: Self-pay | Admitting: Physician Assistant

## 2021-11-20 DIAGNOSIS — E1165 Type 2 diabetes mellitus with hyperglycemia: Secondary | ICD-10-CM

## 2021-11-20 DIAGNOSIS — E669 Obesity, unspecified: Secondary | ICD-10-CM

## 2021-11-25 DIAGNOSIS — E119 Type 2 diabetes mellitus without complications: Secondary | ICD-10-CM | POA: Diagnosis not present

## 2021-11-25 DIAGNOSIS — E785 Hyperlipidemia, unspecified: Secondary | ICD-10-CM | POA: Diagnosis not present

## 2021-11-25 DIAGNOSIS — E559 Vitamin D deficiency, unspecified: Secondary | ICD-10-CM | POA: Diagnosis not present

## 2021-11-25 DIAGNOSIS — N951 Menopausal and female climacteric states: Secondary | ICD-10-CM | POA: Diagnosis not present

## 2021-11-25 DIAGNOSIS — R7989 Other specified abnormal findings of blood chemistry: Secondary | ICD-10-CM | POA: Diagnosis not present

## 2021-11-25 DIAGNOSIS — R635 Abnormal weight gain: Secondary | ICD-10-CM | POA: Diagnosis not present

## 2021-11-25 DIAGNOSIS — R5383 Other fatigue: Secondary | ICD-10-CM | POA: Diagnosis not present

## 2021-11-30 DIAGNOSIS — Z6832 Body mass index (BMI) 32.0-32.9, adult: Secondary | ICD-10-CM | POA: Diagnosis not present

## 2021-11-30 DIAGNOSIS — I10 Essential (primary) hypertension: Secondary | ICD-10-CM | POA: Diagnosis not present

## 2021-11-30 DIAGNOSIS — R635 Abnormal weight gain: Secondary | ICD-10-CM | POA: Diagnosis not present

## 2021-11-30 DIAGNOSIS — Z1339 Encounter for screening examination for other mental health and behavioral disorders: Secondary | ICD-10-CM | POA: Diagnosis not present

## 2021-11-30 DIAGNOSIS — Z1331 Encounter for screening for depression: Secondary | ICD-10-CM | POA: Diagnosis not present

## 2021-11-30 DIAGNOSIS — N951 Menopausal and female climacteric states: Secondary | ICD-10-CM | POA: Diagnosis not present

## 2021-12-13 ENCOUNTER — Telehealth: Payer: Self-pay

## 2021-12-13 DIAGNOSIS — E119 Type 2 diabetes mellitus without complications: Secondary | ICD-10-CM | POA: Diagnosis not present

## 2021-12-13 DIAGNOSIS — Z6832 Body mass index (BMI) 32.0-32.9, adult: Secondary | ICD-10-CM | POA: Diagnosis not present

## 2021-12-13 NOTE — Telephone Encounter (Signed)
Completed medical records for [facility], faxed to [phone]  ?

## 2021-12-22 ENCOUNTER — Other Ambulatory Visit: Payer: Self-pay | Admitting: Physician Assistant

## 2021-12-22 DIAGNOSIS — Z6831 Body mass index (BMI) 31.0-31.9, adult: Secondary | ICD-10-CM | POA: Diagnosis not present

## 2021-12-22 DIAGNOSIS — I10 Essential (primary) hypertension: Secondary | ICD-10-CM | POA: Diagnosis not present

## 2021-12-29 ENCOUNTER — Ambulatory Visit: Payer: BC Managed Care – PPO | Admitting: Physician Assistant

## 2021-12-29 DIAGNOSIS — E559 Vitamin D deficiency, unspecified: Secondary | ICD-10-CM | POA: Diagnosis not present

## 2021-12-29 DIAGNOSIS — Z6831 Body mass index (BMI) 31.0-31.9, adult: Secondary | ICD-10-CM | POA: Diagnosis not present

## 2022-01-02 ENCOUNTER — Ambulatory Visit: Payer: BC Managed Care – PPO | Admitting: Physician Assistant

## 2022-01-02 ENCOUNTER — Other Ambulatory Visit: Payer: Self-pay

## 2022-01-02 ENCOUNTER — Encounter: Payer: Self-pay | Admitting: Physician Assistant

## 2022-01-02 DIAGNOSIS — J4520 Mild intermittent asthma, uncomplicated: Secondary | ICD-10-CM

## 2022-01-02 DIAGNOSIS — E1165 Type 2 diabetes mellitus with hyperglycemia: Secondary | ICD-10-CM | POA: Diagnosis not present

## 2022-01-02 DIAGNOSIS — I10 Essential (primary) hypertension: Secondary | ICD-10-CM

## 2022-01-02 DIAGNOSIS — L989 Disorder of the skin and subcutaneous tissue, unspecified: Secondary | ICD-10-CM

## 2022-01-02 DIAGNOSIS — Z1231 Encounter for screening mammogram for malignant neoplasm of breast: Secondary | ICD-10-CM

## 2022-01-02 LAB — POCT GLYCOSYLATED HEMOGLOBIN (HGB A1C): Hemoglobin A1C: 6 % — AB (ref 4.0–5.6)

## 2022-01-02 MED ORDER — ALBUTEROL SULFATE HFA 108 (90 BASE) MCG/ACT IN AERS: INHALATION_SPRAY | RESPIRATORY_TRACT | 3 refills | Status: DC

## 2022-01-02 MED ORDER — BISOPROLOL-HYDROCHLOROTHIAZIDE 10-6.25 MG PO TABS: 1.0000 | ORAL_TABLET | Freq: Every day | ORAL | 1 refills | Status: DC
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
## 2022-01-04 DIAGNOSIS — Z6830 Body mass index (BMI) 30.0-30.9, adult: Secondary | ICD-10-CM | POA: Diagnosis not present

## 2022-01-04 DIAGNOSIS — E119 Type 2 diabetes mellitus without complications: Secondary | ICD-10-CM | POA: Diagnosis not present

## 2022-01-05 ENCOUNTER — Ambulatory Visit
Admission: RE | Admit: 2022-01-05 | Discharge: 2022-01-05 | Disposition: A | Discharge status: Home / Self Care | Payer: BC Managed Care – PPO | Source: Ambulatory Visit | Attending: Physician Assistant | Admitting: Physician Assistant

## 2022-01-05 DIAGNOSIS — Z1231 Encounter for screening mammogram for malignant neoplasm of breast: Secondary | ICD-10-CM

## 2022-01-12 DIAGNOSIS — E785 Hyperlipidemia, unspecified: Secondary | ICD-10-CM | POA: Diagnosis not present

## 2022-01-12 DIAGNOSIS — Z6830 Body mass index (BMI) 30.0-30.9, adult: Secondary | ICD-10-CM | POA: Diagnosis not present

## 2022-01-19 ENCOUNTER — Other Ambulatory Visit: Payer: Self-pay | Admitting: Physician Assistant

## 2022-01-19 DIAGNOSIS — I10 Essential (primary) hypertension: Secondary | ICD-10-CM | POA: Diagnosis not present

## 2022-01-19 DIAGNOSIS — E669 Obesity, unspecified: Secondary | ICD-10-CM

## 2022-01-19 DIAGNOSIS — Z6830 Body mass index (BMI) 30.0-30.9, adult: Secondary | ICD-10-CM | POA: Diagnosis not present

## 2022-01-19 DIAGNOSIS — E1165 Type 2 diabetes mellitus with hyperglycemia: Secondary | ICD-10-CM

## 2022-01-24 DIAGNOSIS — E119 Type 2 diabetes mellitus without complications: Secondary | ICD-10-CM | POA: Diagnosis not present

## 2022-01-24 DIAGNOSIS — H524 Presbyopia: Secondary | ICD-10-CM | POA: Diagnosis not present

## 2022-01-26 DIAGNOSIS — R7989 Other specified abnormal findings of blood chemistry: Secondary | ICD-10-CM | POA: Diagnosis not present

## 2022-01-26 DIAGNOSIS — Z6829 Body mass index (BMI) 29.0-29.9, adult: Secondary | ICD-10-CM | POA: Diagnosis not present

## 2022-02-02 DIAGNOSIS — E785 Hyperlipidemia, unspecified: Secondary | ICD-10-CM | POA: Diagnosis not present

## 2022-02-02 DIAGNOSIS — N951 Menopausal and female climacteric states: Secondary | ICD-10-CM | POA: Diagnosis not present

## 2022-02-02 DIAGNOSIS — Z6829 Body mass index (BMI) 29.0-29.9, adult: Secondary | ICD-10-CM | POA: Diagnosis not present

## 2022-02-07 ENCOUNTER — Encounter: Payer: Self-pay | Admitting: Physician Assistant

## 2022-02-12 ENCOUNTER — Other Ambulatory Visit: Payer: Self-pay | Admitting: Physician Assistant

## 2022-02-12 DIAGNOSIS — E1165 Type 2 diabetes mellitus with hyperglycemia: Secondary | ICD-10-CM

## 2022-02-13 ENCOUNTER — Other Ambulatory Visit: Payer: Self-pay | Admitting: Physician Assistant

## 2022-02-13 DIAGNOSIS — I10 Essential (primary) hypertension: Secondary | ICD-10-CM

## 2022-02-13 DIAGNOSIS — E782 Mixed hyperlipidemia: Secondary | ICD-10-CM

## 2022-02-16 DIAGNOSIS — Z6828 Body mass index (BMI) 28.0-28.9, adult: Secondary | ICD-10-CM | POA: Diagnosis not present

## 2022-02-16 DIAGNOSIS — E119 Type 2 diabetes mellitus without complications: Secondary | ICD-10-CM | POA: Diagnosis not present

## 2022-03-09 DIAGNOSIS — R1313 Dysphagia, pharyngeal phase: Secondary | ICD-10-CM | POA: Diagnosis not present

## 2022-03-09 DIAGNOSIS — R1310 Dysphagia, unspecified: Secondary | ICD-10-CM | POA: Diagnosis not present

## 2022-03-09 DIAGNOSIS — Q393 Congenital stenosis and stricture of esophagus: Secondary | ICD-10-CM | POA: Diagnosis not present

## 2022-03-30 ENCOUNTER — Ambulatory Visit: Payer: BC Managed Care – PPO | Admitting: Physician Assistant

## 2022-05-02 DIAGNOSIS — R1310 Dysphagia, unspecified: Secondary | ICD-10-CM | POA: Diagnosis not present

## 2022-05-02 DIAGNOSIS — K222 Esophageal obstruction: Secondary | ICD-10-CM | POA: Diagnosis not present

## 2022-05-11 ENCOUNTER — Encounter: Payer: BC Managed Care – PPO | Admitting: Physician Assistant

## 2022-05-25 ENCOUNTER — Encounter: Payer: BC Managed Care – PPO | Admitting: Physician Assistant

## 2022-06-21 DIAGNOSIS — I10 Essential (primary) hypertension: Secondary | ICD-10-CM | POA: Diagnosis not present

## 2022-06-21 DIAGNOSIS — R1310 Dysphagia, unspecified: Secondary | ICD-10-CM | POA: Diagnosis not present

## 2022-06-21 DIAGNOSIS — K222 Esophageal obstruction: Secondary | ICD-10-CM | POA: Diagnosis not present

## 2022-06-21 DIAGNOSIS — Q392 Congenital tracheo-esophageal fistula without atresia: Secondary | ICD-10-CM | POA: Diagnosis not present

## 2022-06-21 DIAGNOSIS — J45909 Unspecified asthma, uncomplicated: Secondary | ICD-10-CM | POA: Diagnosis not present

## 2022-06-28 ENCOUNTER — Other Ambulatory Visit: Payer: Self-pay | Admitting: Physician Assistant

## 2022-06-28 DIAGNOSIS — I10 Essential (primary) hypertension: Secondary | ICD-10-CM

## 2022-06-29 ENCOUNTER — Ambulatory Visit: Payer: BC Managed Care – PPO | Admitting: Dermatology

## 2022-07-06 ENCOUNTER — Encounter: Payer: Self-pay | Admitting: Physician Assistant

## 2022-07-06 ENCOUNTER — Ambulatory Visit (INDEPENDENT_AMBULATORY_CARE_PROVIDER_SITE_OTHER): Payer: BC Managed Care – PPO | Admitting: Physician Assistant

## 2022-07-06 DIAGNOSIS — Z0001 Encounter for general adult medical examination with abnormal findings: Secondary | ICD-10-CM | POA: Diagnosis not present

## 2022-07-06 DIAGNOSIS — J4520 Mild intermittent asthma, uncomplicated: Secondary | ICD-10-CM

## 2022-07-06 DIAGNOSIS — E559 Vitamin D deficiency, unspecified: Secondary | ICD-10-CM

## 2022-07-06 DIAGNOSIS — I10 Essential (primary) hypertension: Secondary | ICD-10-CM

## 2022-07-06 DIAGNOSIS — R300 Dysuria: Secondary | ICD-10-CM

## 2022-07-06 DIAGNOSIS — E782 Mixed hyperlipidemia: Secondary | ICD-10-CM

## 2022-07-06 DIAGNOSIS — Z23 Encounter for immunization: Secondary | ICD-10-CM

## 2022-07-06 DIAGNOSIS — E669 Obesity, unspecified: Secondary | ICD-10-CM

## 2022-07-06 DIAGNOSIS — E1165 Type 2 diabetes mellitus with hyperglycemia: Secondary | ICD-10-CM | POA: Diagnosis not present

## 2022-07-06 DIAGNOSIS — R5383 Other fatigue: Secondary | ICD-10-CM

## 2022-07-06 LAB — POCT GLYCOSYLATED HEMOGLOBIN (HGB A1C): Hemoglobin A1C: 6.2 % — AB (ref 4.0–5.6)

## 2022-07-06 MED ORDER — DAPAGLIFLOZIN PROPANEDIOL 10 MG PO TABS: 10.0000 mg | ORAL_TABLET | Freq: Every day | ORAL | 1 refills | Status: DC

## 2022-07-06 MED ORDER — HYDROCHLOROTHIAZIDE 12.5 MG PO TABS: 12.5000 mg | ORAL_TABLET | Freq: Every day | ORAL | 3 refills | Status: DC

## 2022-07-06 MED ORDER — ALBUTEROL SULFATE HFA 108 (90 BASE) MCG/ACT IN AERS: INHALATION_SPRAY | RESPIRATORY_TRACT | 3 refills | Status: DC

## 2022-07-06 MED ORDER — TIRZEPATIDE 2.5 MG/0.5ML ~~LOC~~ SOAJ: 2.5000 mg | SUBCUTANEOUS | 0 refills | Status: DC

## 2022-07-06 MED ORDER — TETANUS-DIPHTH-ACELL PERTUSSIS 5-2.5-18.5 LF-MCG/0.5 IM SUSY: 0.5000 mL | PREFILLED_SYRINGE | Freq: Once | INTRAMUSCULAR | 0 refills | Status: AC

## 2022-07-06 NOTE — Progress Notes (Signed)
[facility] [place], [place] [address]  Internal MEDICINE  Office Visit Note  Patient Name: Jordan Mccoy  [id number]  [id number]  Date of Service: 07/18/2022  Chief Complaint  Patient presents with   Annual Exam   Hypertension   Diabetes   Quality Metric Gaps    Tetanus, Shingles and Foot Exam     HPI Pt is here for routine health maintenance examination and is doing well today -recently had esophageal dilation and this has helped greatly -Has had her mammogram, UTD on cologaurd, eye exam done in April -Due for fasting labs -Foot exam done -Will think about shingles vaccine, due for tdap -Interested in a weekly Glp1 instead of victoza daily. Would like to try mounjaro and will send today. Advised not to combine with victoza  Current Medication: Outpatient Encounter Medications as of 07/06/2022  Medication Sig   amLODipine (NORVASC) 5 MG tablet TAKE 1 TABLET (5 MG TOTAL) BY MOUTH DAILY.   BD PEN NEEDLE NANO 2ND GEN 32G X 4 MM MISC USE AS DIRECTED WITH VICTOZA   bisoprolol-hydrochlorothiazide (ZIAC) 10-6.25 MG tablet TAKE 1 TABLET BY MOUTH EVERY DAY   Fluticasone-Salmeterol (ADVAIR DISKUS) 250-50 MCG/DOSE AEPB INHALE 1 PUFF(S) TWICE A DAY FOR ASTHMA   glucose blood (ONETOUCH VERIO) test strip Use as directed once a daily diag e11.65   glucose blood test strip Blood sugar testing done once daily - E11.65   hydrochlorothiazide (HYDRODIURIL) 12.5 MG tablet Take 1 tablet (12.5 mg total) by mouth daily.   Lancets (ONETOUCH ULTRASOFT) lancets Blood sugar testing QAM - E11.65   rosuvastatin (CRESTOR) 5 MG tablet TAKE 1 TABLET (5 MG TOTAL) BY MOUTH DAILY.   tirzepatide [facility]) 2.5 MG/0.5ML Pen Inject 2.5 mg into the skin once a week.   [DISCONTINUED] albuterol (VENTOLIN HFA) 108 (90 Base) MCG/ACT inhaler TAKE 2 PUFFS BY MOUTH EVERY 6 HOURS AS NEEDED FOR WHEEZE OR SHORTNESS OF BREATH   [DISCONTINUED] FARXIGA 10 MG TABS tablet TAKE 1 TABLET BY MOUTH EVERY  DAY   [DISCONTINUED] hydrochlorothiazide (HYDRODIURIL) 12.5 MG tablet Take 1 tablet (12.5 mg total) by mouth daily.   [DISCONTINUED] Tdap (BOOSTRIX) 5-2.5-18.5 LF-MCG/0.5 injection Inject 0.5 mLs into the muscle once.   [DISCONTINUED] VICTOZA 18 MG/3ML SOPN INJECT 1.8 MG UNDER THE SKIN ONCE DAILY   albuterol (VENTOLIN HFA) 108 (90 Base) MCG/ACT inhaler TAKE 2 PUFFS BY MOUTH EVERY 6 HOURS AS NEEDED FOR WHEEZE OR SHORTNESS OF BREATH   dapagliflozin propanediol (FARXIGA) 10 MG TABS tablet Take 1 tablet (10 mg total) by mouth daily.   [EXPIRED] Tdap (BOOSTRIX) 5-2.5-18.5 LF-MCG/0.5 injection Inject 0.5 mLs into the muscle once for 1 dose.   No facility-administered encounter medications on file as of 07/06/2022.    Surgical History: Past Surgical History:  Procedure Laterality Date   ABDOMINAL HYSTERECTOMY     CHOLECYSTECTOMY     right foot surgery      Medical History: Past Medical History:  Diagnosis Date   Diabetes mellitus without complication ([place])    Hypertension     Family History: Family History  Problem Relation Age of Onset   Breast cancer Mother 45   Breast cancer Maternal Aunt 33   Cancer Father       Review of Systems  Constitutional:  Negative for chills, fatigue and unexpected weight change.  HENT:  Negative for congestion, postnasal drip, rhinorrhea, sneezing and sore throat.   Eyes:  Negative for redness.  Respiratory:  Negative for cough, chest tightness and  shortness of breath.   Cardiovascular:  Negative for chest pain and palpitations.  Gastrointestinal:  Negative for abdominal pain, constipation, diarrhea, nausea and vomiting.  Genitourinary:  Negative for dysuria and frequency.  Musculoskeletal:  Negative for arthralgias, back pain, joint swelling and neck pain.  Skin:  Negative for rash.  Neurological: Negative.  Negative for tremors and numbness.  Hematological:  Negative for adenopathy. Does not bruise/bleed easily.  Psychiatric/Behavioral:   Negative for behavioral problems (Depression), sleep disturbance and suicidal ideas. The patient is not nervous/anxious.      Vital Signs: BP 136/82   Pulse 75   Temp 98.3 F (36.8 C)   Resp 16   Ht 5\' 9"  (1.753 m)   Wt 213 lb 9.6 oz (96.9 kg)   SpO2 98%   BMI 31.54 kg/m    Physical Exam Vitals and nursing note reviewed.  Constitutional:      General: She is not in acute distress.    Appearance: She is well-developed. She is obese. She is not diaphoretic.  HENT:     Head: Normocephalic and atraumatic.     Mouth/Throat:     Pharynx: No oropharyngeal exudate.  Eyes:     Pupils: Pupils are equal, round, and reactive to light.  Neck:     Thyroid: No thyromegaly.     Vascular: No JVD.     Trachea: No tracheal deviation.  Cardiovascular:     Rate and Rhythm: Normal rate and regular rhythm.     Pulses:          Dorsalis pedis pulses are 3+ on the right side and 3+ on the left side.       Posterior tibial pulses are 3+ on the right side and 3+ on the left side.     Heart sounds: Normal heart sounds. No murmur heard.    No friction rub. No gallop.  Pulmonary:     Effort: Pulmonary effort is normal. No respiratory distress.     Breath sounds: No wheezing or rales.  Chest:     Chest wall: No tenderness.  Abdominal:     General: Bowel sounds are normal.     Palpations: Abdomen is soft.     Tenderness: There is no abdominal tenderness.  Musculoskeletal:        General: Normal range of motion.     Cervical back: Normal range of motion and neck supple.     Right foot: Normal range of motion.     Left foot: Normal range of motion.  Feet:     Right foot:     Protective Sensation: 2 sites tested.  2 sites sensed.     Skin integrity: Skin integrity normal.     Toenail Condition: Right toenails are normal.     Left foot:     Protective Sensation: 2 sites tested.  2 sites sensed.     Skin integrity: Skin integrity normal.     Toenail Condition: Left toenails are normal.   Lymphadenopathy:     Cervical: No cervical adenopathy.  Skin:    General: Skin is warm and dry.  Neurological:     Mental Status: She is alert and oriented to person, place, and time.     Cranial Nerves: No cranial nerve deficit.  Psychiatric:        Behavior: Behavior normal.        Thought Content: Thought content normal.        Judgment: Judgment normal.  LABS: Recent Results (from the past 2160 hour(s))  POCT HgB A1C     Status: Abnormal   Collection Time: 07/06/22  3:11 PM  Result Value Ref Range   Hemoglobin A1C 6.2 (A) 4.0 - 5.6 %   HbA1c POC (<> result, manual entry)     HbA1c, POC (prediabetic range)     HbA1c, POC (controlled diabetic range)    Urine Microalbumin w/creat. ratio     Status: None   Collection Time: 07/06/22  4:20 PM  Result Value Ref Range   Creatinine, Urine 63.6 Not Estab. mg/dL   Microalbumin, Urine 7.7 Not Estab. ug/mL   Microalb/Creat Ratio 12 0 - 29 mg/g creat    Comment:                        Normal:                0 -  29                        Moderately increased: 30 - 300                        Severely increased:       >300   UA/M w/rflx Culture, Routine     Status: Abnormal   Collection Time: 07/06/22  4:20 PM   Urine  Result Value Ref Range   Specific Gravity, UA 1.012 1.005 - 1.030   pH, UA 5.5 5.0 - 7.5   Color, UA Yellow Yellow   Appearance Ur Clear Clear   Leukocytes,UA Negative Negative   Protein,UA Negative Negative/Trace   Glucose, UA Trace (A) Negative   Ketones, UA Negative Negative   RBC, UA Negative Negative   Bilirubin, UA Negative Negative   Urobilinogen, Ur 0.2 0.2 - 1.0 mg/dL   Nitrite, UA Negative Negative   Microscopic Examination Comment     Comment: Microscopic follows if indicated.   Microscopic Examination See below:     Comment: Microscopic was indicated and was performed.   Urinalysis Reflex Comment     Comment: This specimen will not reflex to a Urine Culture.  Microscopic Examination      Status: None   Collection Time: 07/06/22  4:20 PM   Urine  Result Value Ref Range   WBC, UA None seen 0 - 5 /hpf   RBC, Urine None seen 0 - 2 /hpf   Epithelial Cells (non renal) 0-10 0 - 10 /hpf   Casts None seen None seen /lpf   Bacteria, UA None seen None seen/Few       Assessment/Plan: 1. Encounter for general adult medical examination with abnormal findings Cpe performed, due for tdap and shingles otherwise UTD on PHM - CBC w/Diff/Platelet - Comprehensive metabolic panel - TSH + free T4 - Lipid Panel With LDL/HDL Ratio - VITAMIN D 25 Hydroxy (Vit-D Deficiency, Fractures)  2. Type 2 diabetes mellitus with hyperglycemia, without long-term current use of insulin (HCC) - POCT HgB A1C is 6.2 which is up from 6.0 last check. Will continue Farxiga, but will change to mounjaro instead of victoza for weekly dosing and further A1c and wt loss benefits - dapagliflozin propanediol (FARXIGA) 10 MG TABS tablet; Take 1 tablet (10 mg total) by mouth daily.  Dispense: 90 tablet; Refill: 1 - tirzepatide (MOUNJARO) 2.5 MG/0.5ML Pen; Inject 2.5 mg into the skin once a week.  Dispense: 2 mL;  Refill: 0 - Urine Microalbumin w/creat. ratio  3. Essential hypertension Stable, continue current medications - hydrochlorothiazide (HYDRODIURIL) 12.5 MG tablet; Take 1 tablet (12.5 mg total) by mouth daily.  Dispense: 90 tablet; Refill: 3  4. Mild intermittent asthma without complication - albuterol (VENTOLIN HFA) 108 (90 Base) MCG/ACT inhaler; TAKE 2 PUFFS BY MOUTH EVERY 6 HOURS AS NEEDED FOR WHEEZE OR SHORTNESS OF BREATH  Dispense: 8 each; Refill: 3  5. Mixed hyperlipidemia Continue crestor and will update labs - Lipid Panel With LDL/HDL Ratio  6. Vitamin D deficiency - VITAMIN D 25 Hydroxy (Vit-D Deficiency, Fractures)  7. Other fatigue - CBC w/Diff/Platelet - Comprehensive metabolic panel - TSH + free T4 - Lipid Panel With LDL/HDL Ratio - VITAMIN D 25 Hydroxy (Vit-D Deficiency,  Fractures)  8. Flu vaccine need - Flu Vaccine MDCK QUAD PF  9. Obesity (BMI 30.0-34.9) Continue to work on diet and exercise - dapagliflozin propanediol (FARXIGA) 10 MG TABS tablet; Take 1 tablet (10 mg total) by mouth daily.  Dispense: 90 tablet; Refill: 1  10. Dysuria - UA/M w/rflx Culture, Routine   General Counseling: Jordan Mccoy verbalizes understanding of the findings of todays visit and agrees with plan of treatment. I have discussed any further diagnostic evaluation that may be needed or ordered today. We also reviewed her medications today. she has been encouraged to call the office with any questions or concerns that should arise related to todays visit.    Counseling:    Orders Placed This Encounter  Procedures   Microscopic Examination   Flu Vaccine MDCK QUAD PF   UA/M w/rflx Culture, Routine   CBC w/Diff/Platelet   Comprehensive metabolic panel   TSH + free T4   Lipid Panel With LDL/HDL Ratio   VITAMIN D 25 Hydroxy (Vit-D Deficiency, Fractures)   Urine Microalbumin w/creat. ratio   UA/M w/rflx Culture, Routine   POCT HgB A1C    Meds ordered this encounter  Medications   Tdap (BOOSTRIX) 5-2.5-18.5 LF-MCG/0.5 injection    Sig: Inject 0.5 mLs into the muscle once for 1 dose.    Dispense:  0.5 mL    Refill:  0   dapagliflozin propanediol (FARXIGA) 10 MG TABS tablet    Sig: Take 1 tablet (10 mg total) by mouth daily.    Dispense:  90 tablet    Refill:  1   albuterol (VENTOLIN HFA) 108 (90 Base) MCG/ACT inhaler    Sig: TAKE 2 PUFFS BY MOUTH EVERY 6 HOURS AS NEEDED FOR WHEEZE OR SHORTNESS OF BREATH    Dispense:  8 each    Refill:  3   tirzepatide (MOUNJARO) 2.5 MG/0.5ML Pen    Sig: Inject 2.5 mg into the skin once a week.    Dispense:  2 mL    Refill:  0    To replace victoza   hydrochlorothiazide (HYDRODIURIL) 12.5 MG tablet    Sig: Take 1 tablet (12.5 mg total) by mouth daily.    Dispense:  90 tablet    Refill:  3    In addition to Ziac    This  patient was seen by Drema Dallas, PA-C in collaboration with Dr. Clayborn Bigness as a part of collaborative care agreement.  Total time spent:35 Minutes  Time spent includes review of chart, medications, test results, and follow up plan with the patient.     Lavera Guise, MD  Internal Medicine

## 2022-07-07 LAB — UA/M W/RFLX CULTURE, ROUTINE
Bilirubin, UA: NEGATIVE
Ketones, UA: NEGATIVE
Leukocytes,UA: NEGATIVE
Nitrite, UA: NEGATIVE
Protein,UA: NEGATIVE
RBC, UA: NEGATIVE
Specific Gravity, UA: 1.012 (ref 1.005–1.030)
Urobilinogen, Ur: 0.2 mg/dL (ref 0.2–1.0)
pH, UA: 5.5 (ref 5.0–7.5)

## 2022-07-07 LAB — MICROSCOPIC EXAMINATION
Bacteria, UA: NONE SEEN
Casts: NONE SEEN /lpf
RBC, Urine: NONE SEEN /hpf (ref 0–2)
WBC, UA: NONE SEEN /hpf (ref 0–5)

## 2022-07-07 LAB — MICROALBUMIN / CREATININE URINE RATIO
Creatinine, Urine: 63.6 mg/dL
Microalb/Creat Ratio: 12 mg/g creat (ref 0–29)
Microalbumin, Urine: 7.7 ug/mL

## 2022-07-18 ENCOUNTER — Telehealth: Payer: Self-pay

## 2022-07-18 NOTE — Telephone Encounter (Signed)
Pt advised that PA for Darcel Bayley is approved Effective from 07/18/2022 through 07/17/2023.

## 2022-08-10 ENCOUNTER — Ambulatory Visit: Payer: BC Managed Care – PPO | Admitting: Physician Assistant

## 2022-08-16 ENCOUNTER — Other Ambulatory Visit: Payer: Self-pay | Admitting: Physician Assistant

## 2022-08-16 DIAGNOSIS — E1165 Type 2 diabetes mellitus with hyperglycemia: Secondary | ICD-10-CM

## 2022-09-07 ENCOUNTER — Encounter: Payer: Self-pay | Admitting: Physician Assistant

## 2022-09-07 ENCOUNTER — Ambulatory Visit: Payer: BC Managed Care – PPO | Admitting: Physician Assistant

## 2022-09-07 VITALS — BP 138/82 | HR 60 | Temp 97.7°F | Resp 16 | Ht 69.0 in | Wt 215.4 lb

## 2022-09-07 DIAGNOSIS — E1165 Type 2 diabetes mellitus with hyperglycemia: Secondary | ICD-10-CM

## 2022-09-07 DIAGNOSIS — I10 Essential (primary) hypertension: Secondary | ICD-10-CM | POA: Diagnosis not present

## 2022-09-07 DIAGNOSIS — J4520 Mild intermittent asthma, uncomplicated: Secondary | ICD-10-CM | POA: Diagnosis not present

## 2022-09-07 MED ORDER — TIRZEPATIDE 5 MG/0.5ML ~~LOC~~ SOAJ: 5.0000 mg | SUBCUTANEOUS | 1 refills | Status: DC

## 2022-09-07 MED ORDER — FLUTICASONE-SALMETEROL 250-50 MCG/ACT IN AEPB: 1.0000 | INHALATION_SPRAY | Freq: Two times a day (BID) | RESPIRATORY_TRACT | 3 refills | Status: DC

## 2022-09-07 NOTE — Progress Notes (Signed)
[facility] [address] [place], [place] [address]  Internal MEDICINE  Office Visit Note  Patient Name: Jordan Mccoy  [id number]  [id number]  Date of Service: 09/17/2022  Chief Complaint  Patient presents with   Follow-up   Diabetes   Hypertension   Quality Metric Gaps    Shingles and TDAP    HPI Pt is here for routine follow up -BG have been fluctuating some. 120-140 -No side effects from mounjaro, would like to increase dose -Some wheezing  -Not checking BP regularly -Will get fasting blood work soon  Current Medication: Outpatient Encounter Medications as of 09/07/2022  Medication Sig   albuterol (VENTOLIN HFA) 108 (90 Base) MCG/ACT inhaler TAKE 2 PUFFS BY MOUTH EVERY 6 HOURS AS NEEDED FOR WHEEZE OR SHORTNESS OF BREATH   amLODipine (NORVASC) 5 MG tablet TAKE 1 TABLET (5 MG TOTAL) BY MOUTH DAILY.   BD PEN NEEDLE NANO 2ND GEN 32G X 4 MM MISC USE AS DIRECTED WITH VICTOZA   bisoprolol-hydrochlorothiazide (ZIAC) 10-6.25 MG tablet TAKE 1 TABLET BY MOUTH EVERY DAY   dapagliflozin propanediol (FARXIGA) 10 MG TABS tablet Take 1 tablet (10 mg total) by mouth daily.   fluticasone-salmeterol (ADVAIR DISKUS) 250-50 MCG/ACT AEPB Inhale 1 puff into the lungs in the morning and at bedtime.   glucose blood (ONETOUCH VERIO) test strip Use as directed once a daily diag e11.65   glucose blood test strip Blood sugar testing done once daily - E11.65   hydrochlorothiazide (HYDRODIURIL) 12.5 MG tablet Take 1 tablet (12.5 mg total) by mouth daily.   Lancets (ONETOUCH ULTRASOFT) lancets Blood sugar testing QAM - E11.65   rosuvastatin (CRESTOR) 5 MG tablet TAKE 1 TABLET (5 MG TOTAL) BY MOUTH DAILY.   tirzepatide [facility]) 5 MG/0.5ML Pen Inject 5 mg into the skin once a week.   [DISCONTINUED] Fluticasone-Salmeterol (ADVAIR DISKUS) 250-50 MCG/DOSE AEPB INHALE 1 PUFF(S) TWICE A DAY FOR ASTHMA   [DISCONTINUED] tirzepatide (MOUNJARO) 2.5 MG/0.5ML Pen INJECT 2.5 MG SUBCUTANEOUSLY WEEKLY    No facility-administered encounter medications on file as of 09/07/2022.    Surgical History: Past Surgical History:  Procedure Laterality Date   ABDOMINAL HYSTERECTOMY     CHOLECYSTECTOMY     right foot surgery      Medical History: Past Medical History:  Diagnosis Date   Diabetes mellitus without complication (HCC)    Hypertension     Family History: Family History  Problem Relation Age of Onset   Breast cancer Mother 44   Breast cancer Maternal Aunt 85   Cancer Father     Social History   Socioeconomic History   Marital status: Single    Spouse name: Not on file   Number of children: Not on file   Years of education: Not on file   Highest education level: Not on file  Occupational History   Not on file  Tobacco Use   Smoking status: Never   Smokeless tobacco: Never  Vaping Use   Vaping Use: Never used  Substance and Sexual Activity   Alcohol use: Yes    Comment: social   Drug use: No   Sexual activity: Not on file  Other Topics Concern   Not on file  Social History Narrative   Not on file   Social Determinants of Health   Financial Resource Strain: Not on file  Food Insecurity: Not on file  Transportation Needs: Not on file  Physical Activity: Not on file  Stress: Not on file  Social Connections: Not  on file  Intimate Partner Violence: Not on file      Review of Systems  Constitutional:  Negative for chills, fatigue and unexpected weight change.  HENT:  Negative for congestion, postnasal drip, rhinorrhea, sneezing and sore throat.   Eyes:  Negative for redness.  Respiratory:  Negative for cough, chest tightness and shortness of breath.   Cardiovascular:  Negative for chest pain and palpitations.  Gastrointestinal:  Negative for abdominal pain, constipation, diarrhea, nausea and vomiting.  Genitourinary:  Negative for dysuria and frequency.  Musculoskeletal:  Negative for arthralgias, back pain, joint swelling and neck pain.  Skin:   Negative for rash.  Neurological: Negative.  Negative for tremors and numbness.  Hematological:  Negative for adenopathy. Does not bruise/bleed easily.  Psychiatric/Behavioral:  Negative for behavioral problems (Depression), sleep disturbance and suicidal ideas. The patient is not nervous/anxious.     Vital Signs: BP 138/82 Comment: 140/63  Pulse 60   Temp 97.7 F (36.5 C)   Resp 16   Ht 5\' 9"  (1.753 m)   Wt 215 lb 6.4 oz (97.7 kg)   SpO2 99%   BMI 31.81 kg/m    Physical Exam Vitals and nursing note reviewed.  Constitutional:      General: She is not in acute distress.    Appearance: She is well-developed. She is obese. She is not diaphoretic.  HENT:     Head: Normocephalic and atraumatic.     Mouth/Throat:     Pharynx: No oropharyngeal exudate.  Eyes:     Pupils: Pupils are equal, round, and reactive to light.  Neck:     Thyroid: No thyromegaly.     Vascular: No JVD.     Trachea: No tracheal deviation.  Cardiovascular:     Rate and Rhythm: Normal rate and regular rhythm.     Heart sounds: Normal heart sounds. No murmur heard.    No friction rub. No gallop.  Pulmonary:     Effort: Pulmonary effort is normal. No respiratory distress.     Breath sounds: No wheezing or rales.  Chest:     Chest wall: No tenderness.  Abdominal:     General: Bowel sounds are normal.     Palpations: Abdomen is soft.  Musculoskeletal:        General: Normal range of motion.     Cervical back: Normal range of motion and neck supple.  Lymphadenopathy:     Cervical: No cervical adenopathy.  Skin:    General: Skin is warm and dry.  Neurological:     Mental Status: She is alert and oriented to person, place, and time.     Cranial Nerves: No cranial nerve deficit.  Psychiatric:        Behavior: Behavior normal.        Thought Content: Thought content normal.        Judgment: Judgment normal.        Assessment/Plan: 1. Essential hypertension Well controlled, continue current  medications  2. Type 2 diabetes mellitus with hyperglycemia, without long-term current use of insulin (HCC) Will increase to 5mg  weekly and continue to monitor BG - tirzepatide [facility]) 5 MG/0.5ML Pen; Inject 5 mg into the skin once a week.  Dispense: 2 mL; Refill: 1  3. Mild intermittent asthma without complication Continue advair daily and albuterol prn - fluticasone-salmeterol (ADVAIR DISKUS) 250-50 MCG/ACT AEPB; Inhale 1 puff into the lungs in the morning and at bedtime.  Dispense: 3 each; Refill: 3   General Counseling:  understanding of the findings of todays visit and agrees with plan of treatment. I have discussed any further diagnostic evaluation that may be needed or ordered today. We also reviewed her medications today. she has been encouraged to call the office with any questions or concerns that should arise related to todays visit.    No orders of the defined types were placed in this encounter.   Meds ordered this encounter  Medications   fluticasone-salmeterol (ADVAIR DISKUS) 250-50 MCG/ACT AEPB    Sig: Inhale 1 puff into the lungs in the morning and at bedtime.    Dispense:  3 each    Refill:  3   tirzepatide (MOUNJARO) 5 MG/0.5ML Pen    Sig: Inject 5 mg into the skin once a week.    Dispense:  2 mL    Refill:  1    This patient was seen by Lynn Ito, PA-C in collaboration with Dr. Beverely Risen as a part of collaborative care agreement.   Total time spent:30 Minutes Time spent includes review of chart, medications, test results, and follow up plan with the patient.      Dr Lyndon Code Internal medicine

## 2022-10-06 DIAGNOSIS — E559 Vitamin D deficiency, unspecified: Secondary | ICD-10-CM | POA: Diagnosis not present

## 2022-10-06 DIAGNOSIS — E782 Mixed hyperlipidemia: Secondary | ICD-10-CM | POA: Diagnosis not present

## 2022-10-06 DIAGNOSIS — Z0001 Encounter for general adult medical examination with abnormal findings: Secondary | ICD-10-CM | POA: Diagnosis not present

## 2022-10-06 DIAGNOSIS — R5383 Other fatigue: Secondary | ICD-10-CM | POA: Diagnosis not present

## 2022-10-07 LAB — LIPID PANEL WITH LDL/HDL RATIO
Cholesterol, Total: 147 mg/dL (ref 100–199)
HDL: 42 mg/dL (ref >39)
LDL Chol Calc (NIH): 77 mg/dL (ref 0–99)
LDL/HDL Ratio: 1.8 ratio (ref 0.0–3.2)
Triglycerides: 165 mg/dL — ABNORMAL HIGH (ref 0–149)
VLDL Cholesterol Cal: 28 mg/dL (ref 5–40)

## 2022-10-07 LAB — COMPREHENSIVE METABOLIC PANEL
ALT: 18 IU/L (ref 0–32)
AST: 16 IU/L (ref 0–40)
Albumin/Globulin Ratio: 1.7 (ref 1.2–2.2)
Albumin: 4.3 g/dL (ref 3.8–4.9)
Alkaline Phosphatase: 61 IU/L (ref 44–121)
BUN/Creatinine Ratio: 17 (ref 9–23)
BUN: 16 mg/dL (ref 6–24)
Bilirubin Total: 0.3 mg/dL (ref 0.0–1.2)
CO2: 25 mmol/L (ref 20–29)
Calcium: 9.5 mg/dL (ref 8.7–10.2)
Chloride: 103 mmol/L (ref 96–106)
Creatinine, Ser: 0.95 mg/dL (ref 0.57–1.00)
Globulin, Total: 2.6 g/dL (ref 1.5–4.5)
Glucose: 123 mg/dL — ABNORMAL HIGH (ref 70–99)
Potassium: 3.5 mmol/L (ref 3.5–5.2)
Sodium: 144 mmol/L (ref 134–144)
Total Protein: 6.9 g/dL (ref 6.0–8.5)
eGFR: 72 mL/min/{1.73_m2} (ref >59)

## 2022-10-07 LAB — CBC WITH DIFFERENTIAL/PLATELET
Basophils Absolute: 0.1 10*3/uL (ref 0.0–0.2)
Basos: 1 %
EOS (ABSOLUTE): 0.3 10*3/uL (ref 0.0–0.4)
Eos: 3 %
Hematocrit: 41.5 % (ref 34.0–46.6)
Hemoglobin: 14.1 g/dL (ref 11.1–15.9)
Immature Grans (Abs): 0 10*3/uL (ref 0.0–0.1)
Immature Granulocytes: 0 %
Lymphocytes Absolute: 2.8 10*3/uL (ref 0.7–3.1)
Lymphs: 27 %
MCH: 26.7 pg (ref 26.6–33.0)
MCHC: 34 g/dL (ref 31.5–35.7)
MCV: 79 fL (ref 79–97)
Monocytes Absolute: 0.7 10*3/uL (ref 0.1–0.9)
Monocytes: 6 %
Neutrophils Absolute: 6.8 10*3/uL (ref 1.4–7.0)
Neutrophils: 63 %
Platelets: 189 10*3/uL (ref 150–450)
RBC: 5.28 x10E6/uL (ref 3.77–5.28)
RDW: 12.5 % (ref 11.7–15.4)
WBC: 10.6 10*3/uL (ref 3.4–10.8)

## 2022-10-07 LAB — TSH+FREE T4
Free T4: 1.44 ng/dL (ref 0.82–1.77)
TSH: 0.65 u[IU]/mL (ref 0.450–4.500)

## 2022-10-07 LAB — VITAMIN D 25 HYDROXY (VIT D DEFICIENCY, FRACTURES): Vit D, 25-Hydroxy: 22.5 ng/mL — ABNORMAL LOW (ref 30.0–100.0)

## 2022-10-19 ENCOUNTER — Ambulatory Visit: Payer: BC Managed Care – PPO | Admitting: Physician Assistant

## 2022-10-30 ENCOUNTER — Other Ambulatory Visit: Payer: Self-pay | Admitting: Physician Assistant

## 2022-10-30 DIAGNOSIS — I10 Essential (primary) hypertension: Secondary | ICD-10-CM

## 2022-10-30 DIAGNOSIS — E782 Mixed hyperlipidemia: Secondary | ICD-10-CM

## 2022-11-02 ENCOUNTER — Other Ambulatory Visit: Payer: Self-pay | Admitting: Physician Assistant

## 2022-11-02 DIAGNOSIS — J4520 Mild intermittent asthma, uncomplicated: Secondary | ICD-10-CM

## 2022-11-10 ENCOUNTER — Ambulatory Visit: Payer: BC Managed Care – PPO | Admitting: Physician Assistant

## 2022-11-10 ENCOUNTER — Encounter: Payer: Self-pay | Admitting: Physician Assistant

## 2022-11-10 VITALS — BP 112/72 | HR 61 | Temp 98.3°F | Resp 16 | Ht 69.0 in | Wt 212.8 lb

## 2022-11-10 DIAGNOSIS — J4520 Mild intermittent asthma, uncomplicated: Secondary | ICD-10-CM | POA: Diagnosis not present

## 2022-11-10 DIAGNOSIS — E559 Vitamin D deficiency, unspecified: Secondary | ICD-10-CM | POA: Diagnosis not present

## 2022-11-10 DIAGNOSIS — I10 Essential (primary) hypertension: Secondary | ICD-10-CM | POA: Diagnosis not present

## 2022-11-10 DIAGNOSIS — E1165 Type 2 diabetes mellitus with hyperglycemia: Secondary | ICD-10-CM

## 2022-11-10 DIAGNOSIS — E669 Obesity, unspecified: Secondary | ICD-10-CM

## 2022-11-10 LAB — POCT GLYCOSYLATED HEMOGLOBIN (HGB A1C): Hemoglobin A1C: 6.3 % — AB (ref 4.0–5.6)

## 2022-11-10 MED ORDER — ERGOCALCIFEROL 1.25 MG (50000 UT) PO CAPS: ORAL_CAPSULE | ORAL | 3 refills | Status: DC

## 2022-11-10 MED ORDER — TIRZEPATIDE 7.5 MG/0.5ML ~~LOC~~ SOAJ: 7.5000 mg | SUBCUTANEOUS | 1 refills | Status: DC

## 2022-11-10 NOTE — Progress Notes (Signed)
[facility] [address] [place], [place] [address]  Internal MEDICINE  Office Visit Note  Patient Name: Jordan Mccoy  [id number]  [id number]  Date of Service: 11/15/2022  Chief Complaint  Patient presents with   Follow-up   Diabetes   Hypertension   Medication Problem    Needs substitute for Advair    HPI Pt is here for routine follow up -BG 128-130 -Labs reviwed--low vit D and will supplement, TG elevated, but overall cholesterol improved -Down 3lbs since last visit -Advair not available anymore and needs alternative, does use albuterol when needed. Would like to test PFT to see if daily inhaler still needed. Prefers to hold off on alternative to advair at this time and will use albuterol prn and call if she changes her mind and wants alternative prior to testing -Doing well with mounjaro, would like to increase -Has cut out eating out as much which is helping wt loss.  Current Medication: Outpatient Encounter Medications as of 11/10/2022  Medication Sig   albuterol (VENTOLIN HFA) 108 (90 Base) MCG/ACT inhaler TAKE 2 PUFFS BY MOUTH EVERY 6 HOURS AS NEEDED FOR WHEEZE OR SHORTNESS OF BREATH   amLODipine (NORVASC) 5 MG tablet TAKE 1 TABLET (5 MG TOTAL) BY MOUTH DAILY.   BD PEN NEEDLE NANO 2ND GEN 32G X 4 MM MISC USE AS DIRECTED WITH VICTOZA   bisoprolol-hydrochlorothiazide (ZIAC) 10-6.25 MG tablet TAKE 1 TABLET BY MOUTH EVERY DAY   dapagliflozin propanediol (FARXIGA) 10 MG TABS tablet Take 1 tablet (10 mg total) by mouth daily.   ergocalciferol (DRISDOL) 1.25 MG (50000 UT) capsule Take one cap q week   fluticasone-salmeterol (ADVAIR DISKUS) 250-50 MCG/ACT AEPB Inhale 1 puff into the lungs in the morning and at bedtime.   glucose blood (ONETOUCH VERIO) test strip Use as directed once a daily diag e11.65   glucose blood test strip Blood sugar testing done once daily - E11.65   hydrochlorothiazide (HYDRODIURIL) 12.5 MG tablet Take 1 tablet (12.5 mg total) by mouth daily.    Lancets (ONETOUCH ULTRASOFT) lancets Blood sugar testing QAM - E11.65   rosuvastatin (CRESTOR) 5 MG tablet TAKE 1 TABLET (5 MG TOTAL) BY MOUTH DAILY.   tirzepatide (MOUNJARO) 7.5 MG/0.5ML Pen Inject 7.5 mg into the skin once a week.   [DISCONTINUED] tirzepatide [facility]) 5 MG/0.5ML Pen Inject 5 mg into the skin once a week.   No facility-administered encounter medications on file as of 11/10/2022.    Surgical History: Past Surgical History:  Procedure Laterality Date   ABDOMINAL HYSTERECTOMY     CHOLECYSTECTOMY     right foot surgery      Medical History: Past Medical History:  Diagnosis Date   Diabetes mellitus without complication (HCC)    Hypertension     Family History: Family History  Problem Relation Age of Onset   Breast cancer Mother 57   Breast cancer Maternal Aunt 41   Cancer Father     Social History   Socioeconomic History   Marital status: Single    Spouse name: Not on file   Number of children: Not on file   Years of education: Not on file   Highest education level: Not on file  Occupational History   Not on file  Tobacco Use   Smoking status: Never   Smokeless tobacco: Never  Vaping Use   Vaping Use: Never used  Substance and Sexual Activity   Alcohol use: Yes    Comment: social   Drug use: No  Sexual activity: Not on file  Other Topics Concern   Not on file  Social History Narrative   Not on file   Social Determinants of Health   Financial Resource Strain: Not on file  Food Insecurity: Not on file  Transportation Needs: Not on file  Physical Activity: Not on file  Stress: Not on file  Social Connections: Not on file  Intimate Partner Violence: Not on file      Review of Systems  Constitutional:  Negative for chills, fatigue and unexpected weight change.  HENT:  Negative for congestion, postnasal drip, rhinorrhea, sneezing and sore throat.   Eyes:  Negative for redness.  Respiratory:  Negative for cough, chest tightness and  shortness of breath.   Cardiovascular:  Negative for chest pain and palpitations.  Gastrointestinal:  Negative for abdominal pain, constipation, diarrhea, nausea and vomiting.  Genitourinary:  Negative for dysuria and frequency.  Musculoskeletal:  Negative for arthralgias, back pain, joint swelling and neck pain.  Skin:  Negative for rash.  Neurological: Negative.  Negative for tremors and numbness.  Hematological:  Negative for adenopathy. Does not bruise/bleed easily.  Psychiatric/Behavioral:  Negative for behavioral problems (Depression), sleep disturbance and suicidal ideas. The patient is not nervous/anxious.     Vital Signs: BP 112/72 Comment: 142/77  Pulse 61   Temp 98.3 F (36.8 C)   Resp 16   Ht 5\' 9"  (1.753 m)   Wt 212 lb 12.8 oz (96.5 kg)   SpO2 96%   BMI 31.43 kg/m    Physical Exam Vitals and nursing note reviewed.  Constitutional:      General: She is not in acute distress.    Appearance: She is well-developed. She is obese. She is not diaphoretic.  HENT:     Head: Normocephalic and atraumatic.     Mouth/Throat:     Pharynx: No oropharyngeal exudate.  Eyes:     Pupils: Pupils are equal, round, and reactive to light.  Neck:     Thyroid: No thyromegaly.     Vascular: No JVD.     Trachea: No tracheal deviation.  Cardiovascular:     Rate and Rhythm: Normal rate and regular rhythm.     Heart sounds: Normal heart sounds. No murmur heard.    No friction rub. No gallop.  Pulmonary:     Effort: Pulmonary effort is normal. No respiratory distress.     Breath sounds: No wheezing or rales.  Chest:     Chest wall: No tenderness.  Abdominal:     General: Bowel sounds are normal.     Palpations: Abdomen is soft.  Musculoskeletal:        General: Normal range of motion.     Cervical back: Normal range of motion and neck supple.  Lymphadenopathy:     Cervical: No cervical adenopathy.  Skin:    General: Skin is warm and dry.  Neurological:     Mental Status: She  is alert and oriented to person, place, and time.     Cranial Nerves: No cranial nerve deficit.  Psychiatric:        Behavior: Behavior normal.        Thought Content: Thought content normal.        Judgment: Judgment normal.        Assessment/Plan: 1. Essential hypertension Stable, continue current medication  2. Type 2 diabetes mellitus with hyperglycemia, without long-term current use of insulin (HCC) - POCT HgB A1C is 6.3 which is up from 6.2 last  visit. Will increase mounjaro and continue to work on diet and exercise - tirzepatide (MOUNJARO) 7.5 MG/0.5ML Pen; Inject 7.5 mg into the skin once a week.  Dispense: 6 mL; Refill: 1  3. Vitamin D deficiency - ergocalciferol (DRISDOL) 1.25 MG (50000 UT) capsule; Take one cap q week  Dispense: 12 capsule; Refill: 3  4. Mild intermittent asthma without complication Will order PFT, may need alternative to replace advair if needing to use albuterol several times per week. Pt will call if needed prior to PFT results - Pulmonary Function Test; Future  5. Obesity (BMI 30.0-34.9) Down 3lbs since last visit and will continue to work on diet and exercise while also raising mounjaro to help BG and wt loss.   General Counseling: ironesha rothe understanding of the findings of todays visit and agrees with plan of treatment. I have discussed any further diagnostic evaluation that may be needed or ordered today. We also reviewed her medications today. she has been encouraged to call the office with any questions or concerns that should arise related to todays visit.    Orders Placed This Encounter  Procedures   POCT HgB A1C   Pulmonary Function Test    Meds ordered this encounter  Medications   ergocalciferol (DRISDOL) 1.25 MG (50000 UT) capsule    Sig: Take one cap q week    Dispense:  12 capsule    Refill:  3   tirzepatide (MOUNJARO) 7.5 MG/0.5ML Pen    Sig: Inject 7.5 mg into the skin once a week.    Dispense:  6 mL    Refill:   1    This patient was seen by Lynn Ito, PA-C in collaboration with Dr. Beverely Risen as a part of collaborative care agreement.   Total time spent:30 Minutes Time spent includes review of chart, medications, test results, and follow up plan with the patient.      Dr Lyndon Code Internal medicine

## 2022-11-12 ENCOUNTER — Other Ambulatory Visit: Payer: Self-pay | Admitting: Physician Assistant

## 2022-11-12 DIAGNOSIS — E1165 Type 2 diabetes mellitus with hyperglycemia: Secondary | ICD-10-CM

## 2022-11-22 ENCOUNTER — Telehealth: Payer: Self-pay | Admitting: Internal Medicine

## 2022-11-22 NOTE — Telephone Encounter (Signed)
Left vm to confirm 11/29/2022 appointment-Toni

## 2022-11-29 ENCOUNTER — Ambulatory Visit: Payer: BC Managed Care – PPO | Admitting: Internal Medicine

## 2022-12-31 ENCOUNTER — Other Ambulatory Visit: Payer: Self-pay | Admitting: Internal Medicine

## 2022-12-31 DIAGNOSIS — I10 Essential (primary) hypertension: Secondary | ICD-10-CM

## 2023-02-12 ENCOUNTER — Ambulatory Visit (INDEPENDENT_AMBULATORY_CARE_PROVIDER_SITE_OTHER): Payer: BC Managed Care – PPO | Admitting: Physician Assistant

## 2023-02-12 ENCOUNTER — Encounter: Payer: Self-pay | Admitting: Physician Assistant

## 2023-02-12 VITALS — BP 130/85 | HR 75 | Temp 97.6°F | Resp 16 | Ht 69.0 in | Wt 208.4 lb

## 2023-02-12 DIAGNOSIS — J4520 Mild intermittent asthma, uncomplicated: Secondary | ICD-10-CM | POA: Diagnosis not present

## 2023-02-12 DIAGNOSIS — E1165 Type 2 diabetes mellitus with hyperglycemia: Secondary | ICD-10-CM | POA: Diagnosis not present

## 2023-02-12 DIAGNOSIS — Z1231 Encounter for screening mammogram for malignant neoplasm of breast: Secondary | ICD-10-CM | POA: Diagnosis not present

## 2023-02-12 DIAGNOSIS — I10 Essential (primary) hypertension: Secondary | ICD-10-CM

## 2023-02-12 DIAGNOSIS — E669 Obesity, unspecified: Secondary | ICD-10-CM

## 2023-02-12 LAB — POCT GLYCOSYLATED HEMOGLOBIN (HGB A1C): Hemoglobin A1C: 6 % — AB (ref 4.0–5.6)

## 2023-02-12 NOTE — Progress Notes (Signed)
[facility] [address] [place], [place] [address]  Internal MEDICINE  Office Visit Note  Patient Name: Jordan Mccoy  [id number]  [id number]  Date of Service: 02/16/2023  Chief Complaint  Patient presents with   Follow-up   Diabetes   Hypertension    HPI Pt is here for routine follow up -BG well controlled, typically low 100s -tolerating mounjaro, states she has a script ready to pick it up today -Down 4lbs since last visit -walking more and cooking -did not have PFT due to cost, has not been using any daily inhaler -uses albuterol as needed 1-2 times per week -eye exam in 2 weeks -considering shingles vaccine  Current Medication: Outpatient Encounter Medications as of 02/12/2023  Medication Sig   albuterol (VENTOLIN HFA) 108 (90 Base) MCG/ACT inhaler TAKE 2 PUFFS BY MOUTH EVERY 6 HOURS AS NEEDED FOR WHEEZE OR SHORTNESS OF BREATH   amLODipine (NORVASC) 5 MG tablet TAKE 1 TABLET (5 MG TOTAL) BY MOUTH DAILY.   BD PEN NEEDLE NANO 2ND GEN 32G X 4 MM MISC USE AS DIRECTED WITH VICTOZA   bisoprolol-hydrochlorothiazide (ZIAC) 10-6.25 MG tablet TAKE 1 TABLET BY MOUTH EVERY DAY   dapagliflozin propanediol (FARXIGA) 10 MG TABS tablet Take 1 tablet (10 mg total) by mouth daily.   ergocalciferol (DRISDOL) 1.25 MG (50000 UT) capsule Take one cap q week   fluticasone-salmeterol (ADVAIR DISKUS) 250-50 MCG/ACT AEPB Inhale 1 puff into the lungs in the morning and at bedtime.   glucose blood (ONETOUCH VERIO) test strip Use as directed once a daily diag e11.65   glucose blood test strip Blood sugar testing done once daily - E11.65   hydrochlorothiazide (HYDRODIURIL) 12.5 MG tablet Take 1 tablet (12.5 mg total) by mouth daily.   Lancets (ONETOUCH ULTRASOFT) lancets Blood sugar testing QAM - E11.65   rosuvastatin (CRESTOR) 5 MG tablet TAKE 1 TABLET (5 MG TOTAL) BY MOUTH DAILY.   tirzepatide (MOUNJARO) 7.5 MG/0.5ML Pen Inject 7.5 mg into the skin once a week.   No  facility-administered encounter medications on file as of 02/12/2023.    Surgical History: Past Surgical History:  Procedure Laterality Date   ABDOMINAL HYSTERECTOMY     CHOLECYSTECTOMY     right foot surgery      Medical History: Past Medical History:  Diagnosis Date   Diabetes mellitus without complication (HCC)    Hypertension     Family History: Family History  Problem Relation Age of Onset   Breast cancer Mother 9   Breast cancer Maternal Aunt 51   Cancer Father     Social History   Socioeconomic History   Marital status: Single    Spouse name: Not on file   Number of children: Not on file   Years of education: Not on file   Highest education level: Not on file  Occupational History   Not on file  Tobacco Use   Smoking status: Never   Smokeless tobacco: Never  Vaping Use   Vaping Use: Never used  Substance and Sexual Activity   Alcohol use: Yes    Comment: social   Drug use: No   Sexual activity: Not on file  Other Topics Concern   Not on file  Social History Narrative   Not on file   Social Determinants of Health   Financial Resource Strain: Not on file  Food Insecurity: Not on file  Transportation Needs: Not on file  Physical Activity: Not on file  Stress: Not on file  Social  Connections: Not on file  Intimate Partner Violence: Not on file      Review of Systems  Constitutional:  Negative for chills, fatigue and unexpected weight change.  HENT:  Negative for congestion, postnasal drip, rhinorrhea, sneezing and sore throat.   Eyes:  Negative for redness.  Respiratory:  Negative for cough, chest tightness and shortness of breath.   Cardiovascular:  Negative for chest pain and palpitations.  Gastrointestinal:  Negative for abdominal pain, constipation, diarrhea, nausea and vomiting.  Genitourinary:  Negative for dysuria and frequency.  Musculoskeletal:  Negative for arthralgias, back pain, joint swelling and neck pain.  Skin:  Negative for  rash.  Neurological: Negative.  Negative for tremors and numbness.  Hematological:  Negative for adenopathy. Does not bruise/bleed easily.  Psychiatric/Behavioral:  Negative for behavioral problems (Depression), sleep disturbance and suicidal ideas. The patient is not nervous/anxious.     Vital Signs: BP 130/85   Pulse 75   Temp 97.6 F (36.4 C)   Resp 16   Ht 5\' 9"  (1.753 m)   Wt 208 lb 6.4 oz (94.5 kg)   SpO2 94%   BMI 30.78 kg/m    Physical Exam Vitals and nursing note reviewed.  Constitutional:      General: She is not in acute distress.    Appearance: She is well-developed. She is obese. She is not diaphoretic.  HENT:     Head: Normocephalic and atraumatic.     Mouth/Throat:     Pharynx: No oropharyngeal exudate.  Eyes:     Pupils: Pupils are equal, round, and reactive to light.  Neck:     Thyroid: No thyromegaly.     Vascular: No JVD.     Trachea: No tracheal deviation.  Cardiovascular:     Rate and Rhythm: Normal rate and regular rhythm.     Heart sounds: Normal heart sounds. No murmur heard.    No friction rub. No gallop.  Pulmonary:     Effort: Pulmonary effort is normal. No respiratory distress.     Breath sounds: No wheezing or rales.  Chest:     Chest wall: No tenderness.  Abdominal:     General: Bowel sounds are normal.     Palpations: Abdomen is soft.  Musculoskeletal:        General: Normal range of motion.     Cervical back: Normal range of motion and neck supple.  Lymphadenopathy:     Cervical: No cervical adenopathy.  Skin:    General: Skin is warm and dry.  Neurological:     Mental Status: She is alert and oriented to person, place, and time.     Cranial Nerves: No cranial nerve deficit.  Psychiatric:        Behavior: Behavior normal.        Thought Content: Thought content normal.        Judgment: Judgment normal.        Assessment/Plan: 1. Type 2 diabetes mellitus with hyperglycemia, without long-term current use of insulin  (HCC) - POCT HgB A1C is 6.0 which is improved from 6.3 last visit and will continue current medications while working on diet and exercise  2. Essential hypertension Stable, continue current medications  3. Mild intermittent asthma without complication Continue albuterol as needed, advair backordered but breathing has been stable without it and will monitor. If worsening may need to try alternative and will call if so  4. Visit for screening mammogram - MM 3D SCREENING MAMMOGRAM BILATERAL BREAST; Future  5.  Obesity (BMI 30.0-34.9) Down 4lbs since last visit, continue mounjaro and working on diet and exercise   General Counseling: Jordan Mccoy verbalizes understanding of the findings of todays visit and agrees with plan of treatment. I have discussed any further diagnostic evaluation that may be needed or ordered today. We also reviewed her medications today. she has been encouraged to call the office with any questions or concerns that should arise related to todays visit.    Orders Placed This Encounter  Procedures   MM 3D SCREENING MAMMOGRAM BILATERAL BREAST   POCT HgB A1C    No orders of the defined types were placed in this encounter.   This patient was seen by Lynn Ito, PA-C in collaboration with Dr. Beverely Risen as a part of collaborative care agreement.   Total time spent:30 Minutes Time spent includes review of chart, medications, test results, and follow up plan with the patient.      Dr Lyndon Code Internal medicine

## 2023-03-02 DIAGNOSIS — H524 Presbyopia: Secondary | ICD-10-CM | POA: Diagnosis not present

## 2023-03-02 DIAGNOSIS — E119 Type 2 diabetes mellitus without complications: Secondary | ICD-10-CM | POA: Diagnosis not present

## 2023-04-02 ENCOUNTER — Other Ambulatory Visit: Payer: Self-pay | Admitting: Physician Assistant

## 2023-04-02 DIAGNOSIS — I10 Essential (primary) hypertension: Secondary | ICD-10-CM

## 2023-04-05 ENCOUNTER — Other Ambulatory Visit: Payer: Self-pay | Admitting: Physician Assistant

## 2023-04-05 DIAGNOSIS — E669 Obesity, unspecified: Secondary | ICD-10-CM

## 2023-04-05 DIAGNOSIS — E1165 Type 2 diabetes mellitus with hyperglycemia: Secondary | ICD-10-CM

## 2023-04-22 ENCOUNTER — Other Ambulatory Visit: Payer: Self-pay | Admitting: Physician Assistant

## 2023-04-22 DIAGNOSIS — I10 Essential (primary) hypertension: Secondary | ICD-10-CM

## 2023-04-22 DIAGNOSIS — E782 Mixed hyperlipidemia: Secondary | ICD-10-CM

## 2023-05-03 ENCOUNTER — Other Ambulatory Visit: Payer: Self-pay | Admitting: Physician Assistant

## 2023-05-03 DIAGNOSIS — E1165 Type 2 diabetes mellitus with hyperglycemia: Secondary | ICD-10-CM

## 2023-07-04 ENCOUNTER — Other Ambulatory Visit: Payer: Self-pay | Admitting: Physician Assistant

## 2023-07-04 DIAGNOSIS — I10 Essential (primary) hypertension: Secondary | ICD-10-CM

## 2023-07-12 ENCOUNTER — Ambulatory Visit (INDEPENDENT_AMBULATORY_CARE_PROVIDER_SITE_OTHER): Payer: BLUE CROSS/BLUE SHIELD | Admitting: Physician Assistant

## 2023-07-12 ENCOUNTER — Encounter: Payer: Self-pay | Admitting: Physician Assistant

## 2023-07-12 VITALS — BP 120/70 | HR 63 | Temp 98.3°F | Resp 16 | Ht 69.0 in | Wt 213.2 lb

## 2023-07-12 DIAGNOSIS — J4520 Mild intermittent asthma, uncomplicated: Secondary | ICD-10-CM

## 2023-07-12 DIAGNOSIS — E66811 Obesity, class 1: Secondary | ICD-10-CM

## 2023-07-12 DIAGNOSIS — I10 Essential (primary) hypertension: Secondary | ICD-10-CM

## 2023-07-12 DIAGNOSIS — E1165 Type 2 diabetes mellitus with hyperglycemia: Secondary | ICD-10-CM

## 2023-07-12 DIAGNOSIS — Z0001 Encounter for general adult medical examination with abnormal findings: Secondary | ICD-10-CM

## 2023-07-12 DIAGNOSIS — Z1212 Encounter for screening for malignant neoplasm of rectum: Secondary | ICD-10-CM

## 2023-07-12 DIAGNOSIS — Z1211 Encounter for screening for malignant neoplasm of colon: Secondary | ICD-10-CM

## 2023-07-12 DIAGNOSIS — E782 Mixed hyperlipidemia: Secondary | ICD-10-CM | POA: Diagnosis not present

## 2023-07-12 MED ORDER — TIRZEPATIDE 2.5 MG/0.5ML ~~LOC~~ SOAJ: 2.5000 mg | SUBCUTANEOUS | 0 refills | Status: DC

## 2023-07-12 MED ORDER — ROSUVASTATIN CALCIUM 5 MG PO TABS: 5.0000 mg | ORAL_TABLET | Freq: Every day | ORAL | 1 refills | Status: DC

## 2023-07-12 MED ORDER — AMLODIPINE BESYLATE 5 MG PO TABS
5.0000 mg | ORAL_TABLET | Freq: Every day | ORAL | 1 refills | Status: DC
Start: 2023-07-12 — End: 2024-04-01

## 2023-07-12 MED ORDER — BISOPROLOL-HYDROCHLOROTHIAZIDE 10-6.25 MG PO TABS
1.0000 | ORAL_TABLET | Freq: Every day | ORAL | 1 refills | Status: DC
Start: 2023-07-12 — End: 2024-01-03

## 2023-07-12 MED ORDER — DAPAGLIFLOZIN PROPANEDIOL 10 MG PO TABS: 10.0000 mg | ORAL_TABLET | Freq: Every day | ORAL | 1 refills | Status: AC

## 2023-07-12 MED ORDER — HYDROCHLOROTHIAZIDE 12.5 MG PO TABS
12.5000 mg | ORAL_TABLET | Freq: Every day | ORAL | 3 refills | Status: DC
Start: 2023-07-12 — End: 2024-09-01

## 2023-07-12 MED ORDER — FLUTICASONE-SALMETEROL 250-50 MCG/ACT IN AEPB: 1.0000 | INHALATION_SPRAY | Freq: Two times a day (BID) | RESPIRATORY_TRACT | 3 refills | Status: DC

## 2023-07-12 NOTE — Progress Notes (Signed)
[facility] [address] [place], [place] [address]  Internal MEDICINE  Office Visit Note  Patient Name: Jordan Mccoy  [id number]  [id number]  Date of Service: 07/17/2023  Chief Complaint  Patient presents with   Annual Exam   Diabetes   Hypertension     HPI Pt is here for routine health maintenance examination -Asthma has been a little worse the last week, some wheezing/whistling. Uses albuterol but not advair due to insurance not covering anymore, however insurance changed and will try resending script. Also given sample of airsupra to use instead of albuterol to help further and may call if script desired -SOB only if exerting herself -has been without mounjaro for the last month or two due to insurance lapse, will need to go back to lower dose -will schedule mammogram (already ordered in system)--may need order faxed for [facility] and will let us know -declines shingles vaccine  Current Medication: Outpatient Encounter Medications as of 07/12/2023  Medication Sig   albuterol (VENTOLIN HFA) 108 (90 Base) MCG/ACT inhaler TAKE 2 PUFFS BY MOUTH EVERY 6 HOURS AS NEEDED FOR WHEEZE OR SHORTNESS OF BREATH   BD PEN NEEDLE NANO 2ND GEN 32G X 4 MM MISC USE AS DIRECTED WITH VICTOZA   ergocalciferol (DRISDOL) 1.25 MG (50000 UT) capsule Take one cap q week   glucose blood (ONETOUCH VERIO) test strip Use as directed once a daily diag e11.65   glucose blood test strip Blood sugar testing done once daily - E11.65   Lancets (ONETOUCH ULTRASOFT) lancets Blood sugar testing QAM - E11.65   tirzepatide (MOUNJARO) 2.5 MG/0.5ML Pen Inject 2.5 mg into the skin once a week.   [DISCONTINUED] amLODipine (NORVASC) 5 MG tablet TAKE 1 TABLET (5 MG TOTAL) BY MOUTH DAILY.   [DISCONTINUED] bisoprolol-hydrochlorothiazide (ZIAC) 10-6.25 MG tablet TAKE 1 TABLET BY MOUTH EVERY DAY   [DISCONTINUED] FARXIGA 10 MG TABS tablet TAKE 1 TABLET BY MOUTH EVERY DAY   [DISCONTINUED] fluticasone-salmeterol (ADVAIR  DISKUS) 250-50 MCG/ACT AEPB Inhale 1 puff into the lungs in the morning and at bedtime.   [DISCONTINUED] hydrochlorothiazide (HYDRODIURIL) 12.5 MG tablet TAKE 1 TABLET BY MOUTH EVERY DAY   [DISCONTINUED] rosuvastatin (CRESTOR) 5 MG tablet TAKE 1 TABLET (5 MG TOTAL) BY MOUTH DAILY.   [DISCONTINUED] tirzepatide (MOUNJARO) 7.5 MG/0.5ML Pen INJECT 7.5 MG SUBCUTANEOUSLY WEEKLY   amLODipine (NORVASC) 5 MG tablet Take 1 tablet (5 mg total) by mouth daily.   bisoprolol-hydrochlorothiazide (ZIAC) 10-6.25 MG tablet Take 1 tablet by mouth daily.   dapagliflozin propanediol (FARXIGA) 10 MG TABS tablet Take 1 tablet (10 mg total) by mouth daily.   fluticasone-salmeterol (ADVAIR DISKUS) 250-50 MCG/ACT AEPB Inhale 1 puff into the lungs in the morning and at bedtime.   hydrochlorothiazide (HYDRODIURIL) 12.5 MG tablet Take 1 tablet (12.5 mg total) by mouth daily.   rosuvastatin (CRESTOR) 5 MG tablet Take 1 tablet (5 mg total) by mouth daily.   [DISCONTINUED] amLODipine (NORVASC) 5 MG tablet TAKE 1 TABLET (5 MG TOTAL) BY MOUTH DAILY.   [DISCONTINUED] bisoprolol-hydrochlorothiazide (ZIAC) 10-6.25 MG tablet TAKE 1 TABLET BY MOUTH EVERY DAY   [DISCONTINUED] dapagliflozin propanediol (FARXIGA) 10 MG TABS tablet Take 1 tablet (10 mg total) by mouth daily.   [DISCONTINUED] hydrochlorothiazide (HYDRODIURIL) 12.5 MG tablet Take 1 tablet (12.5 mg total) by mouth daily.   [DISCONTINUED] rosuvastatin (CRESTOR) 5 MG tablet TAKE 1 TABLET (5 MG TOTAL) BY MOUTH DAILY.   [DISCONTINUED] tirzepatide (MOUNJARO) 7.5 MG/0.5ML Pen Inject 7.5 mg into the skin once a week.  No facility-administered encounter medications on file as of 07/12/2023.    Surgical History: Past Surgical History:  Procedure Laterality Date   ABDOMINAL HYSTERECTOMY     CHOLECYSTECTOMY     right foot surgery      Medical History: Past Medical History:  Diagnosis Date   Diabetes mellitus without complication (HCC)    Hypertension     Family  History: Family History  Problem Relation Age of Onset   Breast cancer Mother 15   Breast cancer Maternal Aunt 63   Cancer Father       Review of Systems  Constitutional:  Negative for chills, fatigue and unexpected weight change.  HENT:  Negative for congestion, postnasal drip, rhinorrhea, sneezing and sore throat.   Eyes:  Negative for redness.  Respiratory:  Positive for wheezing. Negative for cough and chest tightness.   Cardiovascular:  Negative for chest pain and palpitations.  Gastrointestinal:  Negative for abdominal pain, constipation, diarrhea, nausea and vomiting.  Genitourinary:  Negative for dysuria and frequency.  Musculoskeletal:  Negative for arthralgias, back pain, joint swelling and neck pain.  Skin:  Negative for rash.  Neurological: Negative.  Negative for tremors and numbness.  Hematological:  Negative for adenopathy. Does not bruise/bleed easily.  Psychiatric/Behavioral:  Negative for behavioral problems (Depression), sleep disturbance and suicidal ideas. The patient is not nervous/anxious.      Vital Signs: BP 120/70   Pulse 63   Temp 98.3 F (36.8 C)   Resp 16   Ht 5\' 9"  (1.753 m)   Wt 213 lb 3.2 oz (96.7 kg)   SpO2 96%   BMI 31.48 kg/m    Physical Exam Vitals and nursing note reviewed.  Constitutional:      General: She is not in acute distress.    Appearance: She is well-developed. She is obese. She is not diaphoretic.  HENT:     Head: Normocephalic and atraumatic.     Mouth/Throat:     Pharynx: No oropharyngeal exudate.  Eyes:     Pupils: Pupils are equal, round, and reactive to light.  Neck:     Thyroid: No thyromegaly.     Vascular: No JVD.     Trachea: No tracheal deviation.  Cardiovascular:     Rate and Rhythm: Normal rate and regular rhythm.     Heart sounds: Normal heart sounds. No murmur heard.    No friction rub. No gallop.  Pulmonary:     Effort: Pulmonary effort is normal. No respiratory distress.     Breath sounds: No  wheezing or rales.  Chest:     Chest wall: No tenderness.  Abdominal:     General: Bowel sounds are normal.     Palpations: Abdomen is soft.  Musculoskeletal:        General: Normal range of motion.     Cervical back: Normal range of motion and neck supple.  Lymphadenopathy:     Cervical: No cervical adenopathy.  Skin:    General: Skin is warm and dry.  Neurological:     Mental Status: She is alert and oriented to person, place, and time.     Cranial Nerves: No cranial nerve deficit.  Psychiatric:        Behavior: Behavior normal.        Thought Content: Thought content normal.        Judgment: Judgment normal.      LABS: No results found for this or any previous visit (from the past 2160 hour(s)).  Assessment/Plan: 1. Encounter for general adult medical examination with abnormal findings CPE performed, will schedule mammogram and update cologuard  2. Mild intermittent asthma without complication Given airsupra sample in place of albuterol for further help, especially in case advair not filled immediately - fluticasone-salmeterol (ADVAIR DISKUS) 250-50 MCG/ACT AEPB; Inhale 1 puff into the lungs in the morning and at bedtime.  Dispense: 3 each; Refill: 3  3. Type 2 diabetes mellitus with hyperglycemia, without long-term current use of insulin (HCC) Will restart mounjaro at lower dose and titrate - tirzepatide [facility]) 2.5 MG/0.5ML Pen; Inject 2.5 mg into the skin once a week.  Dispense: 2 mL; Refill: 0 - dapagliflozin propanediol (FARXIGA) 10 MG TABS tablet; Take 1 tablet (10 mg total) by mouth daily.  Dispense: 90 tablet; Refill: 1  4. Mixed hyperlipidemia - rosuvastatin (CRESTOR) 5 MG tablet; Take 1 tablet (5 mg total) by mouth daily.  Dispense: 90 tablet; Refill: 1  5. Essential hypertension - amLODipine (NORVASC) 5 MG tablet; Take 1 tablet (5 mg total) by mouth daily.  Dispense: 90 tablet; Refill: 1 - bisoprolol-hydrochlorothiazide (ZIAC) 10-6.25 MG tablet;  Take 1 tablet by mouth daily.  Dispense: 90 tablet; Refill: 1 - hydrochlorothiazide (HYDRODIURIL) 12.5 MG tablet; Take 1 tablet (12.5 mg total) by mouth daily.  Dispense: 90 tablet; Refill: 3  6. Screening for colorectal cancer - Cologuard  7. Obesity (BMI 30.0-34.9) Continue to work on diet and exercise - dapagliflozin propanediol (FARXIGA) 10 MG TABS tablet; Take 1 tablet (10 mg total) by mouth daily.  Dispense: 90 tablet; Refill: 1   General Counseling: Scotty verbalizes understanding of the findings of todays visit and agrees with plan of treatment. I have discussed any further diagnostic evaluation that may be needed or ordered today. We also reviewed her medications today. she has been encouraged to call the office with any questions or concerns that should arise related to todays visit.    Counseling:    Orders Placed This Encounter  Procedures   Cologuard    Meds ordered this encounter  Medications   fluticasone-salmeterol (ADVAIR DISKUS) 250-50 MCG/ACT AEPB    Sig: Inhale 1 puff into the lungs in the morning and at bedtime.    Dispense:  3 each    Refill:  3   tirzepatide (MOUNJARO) 2.5 MG/0.5ML Pen    Sig: Inject 2.5 mg into the skin once a week.    Dispense:  2 mL    Refill:  0   dapagliflozin propanediol (FARXIGA) 10 MG TABS tablet    Sig: Take 1 tablet (10 mg total) by mouth daily.    Dispense:  90 tablet    Refill:  1   rosuvastatin (CRESTOR) 5 MG tablet    Sig: Take 1 tablet (5 mg total) by mouth daily.    Dispense:  90 tablet    Refill:  1   amLODipine (NORVASC) 5 MG tablet    Sig: Take 1 tablet (5 mg total) by mouth daily.    Dispense:  90 tablet    Refill:  1   bisoprolol-hydrochlorothiazide (ZIAC) 10-6.25 MG tablet    Sig: Take 1 tablet by mouth daily.    Dispense:  90 tablet    Refill:  1   hydrochlorothiazide (HYDRODIURIL) 12.5 MG tablet    Sig: Take 1 tablet (12.5 mg total) by mouth daily.    Dispense:  90 tablet    Refill:  3    This  patient was seen by Lynn Ito, PA-C in collaboration  with Dr. Beverely Risen as a part of collaborative care agreement.  Total time spent:35 Minutes  Time spent includes review of chart, medications, test results, and follow up plan with the patient.     Lyndon Code, MD  Internal Medicine

## 2023-08-06 ENCOUNTER — Other Ambulatory Visit: Payer: Self-pay

## 2023-08-06 DIAGNOSIS — E1165 Type 2 diabetes mellitus with hyperglycemia: Secondary | ICD-10-CM

## 2023-08-06 MED ORDER — TIRZEPATIDE 2.5 MG/0.5ML ~~LOC~~ SOAJ: 2.5000 mg | SUBCUTANEOUS | 0 refills | Status: DC

## 2023-08-09 ENCOUNTER — Ambulatory Visit: Payer: BLUE CROSS/BLUE SHIELD | Admitting: Physician Assistant

## 2023-08-10 DIAGNOSIS — Z1212 Encounter for screening for malignant neoplasm of rectum: Secondary | ICD-10-CM | POA: Diagnosis not present

## 2023-08-10 DIAGNOSIS — Z1211 Encounter for screening for malignant neoplasm of colon: Secondary | ICD-10-CM | POA: Diagnosis not present

## 2023-08-16 ENCOUNTER — Encounter: Payer: Self-pay | Admitting: Physician Assistant

## 2023-08-16 ENCOUNTER — Ambulatory Visit (INDEPENDENT_AMBULATORY_CARE_PROVIDER_SITE_OTHER): Payer: BLUE CROSS/BLUE SHIELD | Admitting: Physician Assistant

## 2023-08-16 VITALS — BP 125/80 | HR 85 | Temp 97.8°F | Resp 16 | Ht 69.0 in | Wt 212.4 lb

## 2023-08-16 DIAGNOSIS — I10 Essential (primary) hypertension: Secondary | ICD-10-CM

## 2023-08-16 DIAGNOSIS — Z1329 Encounter for screening for other suspected endocrine disorder: Secondary | ICD-10-CM

## 2023-08-16 DIAGNOSIS — E1165 Type 2 diabetes mellitus with hyperglycemia: Secondary | ICD-10-CM

## 2023-08-16 DIAGNOSIS — R5383 Other fatigue: Secondary | ICD-10-CM

## 2023-08-16 DIAGNOSIS — E559 Vitamin D deficiency, unspecified: Secondary | ICD-10-CM | POA: Diagnosis not present

## 2023-08-16 DIAGNOSIS — E782 Mixed hyperlipidemia: Secondary | ICD-10-CM | POA: Diagnosis not present

## 2023-08-16 LAB — POCT GLYCOSYLATED HEMOGLOBIN (HGB A1C): Hemoglobin A1C: 6.9 % — AB (ref 4.0–5.6)

## 2023-08-16 MED ORDER — TIRZEPATIDE 5 MG/0.5ML ~~LOC~~ SOAJ: 5.0000 mg | SUBCUTANEOUS | 2 refills | Status: DC

## 2023-08-16 NOTE — Progress Notes (Signed)
[facility] [address] [place], [place] [address]  Internal MEDICINE  Office Visit Note  Patient Name: Jordan Mccoy  [id number]  [id number]  Date of Service: 08/22/2023  Chief Complaint  Patient presents with   Follow-up   Diabetes   Hypertension    HPI Pt is here for routine follow up -BG doing well, 132 this morning -working on diet and cutting back on salt and eating out as much -Cologuard just submitted, results not back yet -Doing well with mounjaro-had to go back to lower dose after a gap. Will titrate back up now -Doing wixhela inhaler daily and airsupra as needed  Current Medication: Outpatient Encounter Medications as of 08/16/2023  Medication Sig   albuterol (VENTOLIN HFA) 108 (90 Base) MCG/ACT inhaler TAKE 2 PUFFS BY MOUTH EVERY 6 HOURS AS NEEDED FOR WHEEZE OR SHORTNESS OF BREATH   amLODipine (NORVASC) 5 MG tablet Take 1 tablet (5 mg total) by mouth daily.   BD PEN NEEDLE NANO 2ND GEN 32G X 4 MM MISC USE AS DIRECTED WITH VICTOZA   bisoprolol-hydrochlorothiazide (ZIAC) 10-6.25 MG tablet Take 1 tablet by mouth daily.   dapagliflozin propanediol (FARXIGA) 10 MG TABS tablet Take 1 tablet (10 mg total) by mouth daily.   ergocalciferol (DRISDOL) 1.25 MG (50000 UT) capsule Take one cap q week   fluticasone-salmeterol (ADVAIR DISKUS) 250-50 MCG/ACT AEPB Inhale 1 puff into the lungs in the morning and at bedtime.   glucose blood (ONETOUCH VERIO) test strip Use as directed once a daily diag e11.65   glucose blood test strip Blood sugar testing done once daily - E11.65   hydrochlorothiazide (HYDRODIURIL) 12.5 MG tablet Take 1 tablet (12.5 mg total) by mouth daily.   Lancets (ONETOUCH ULTRASOFT) lancets Blood sugar testing QAM - E11.65   rosuvastatin (CRESTOR) 5 MG tablet Take 1 tablet (5 mg total) by mouth daily.   tirzepatide [facility]) 5 MG/0.5ML Pen Inject 5 mg into the skin once a week.   [DISCONTINUED] tirzepatide [facility]) 2.5 MG/0.5ML Pen Inject 2.5 mg  into the skin once a week.   No facility-administered encounter medications on file as of 08/16/2023.    Surgical History: Past Surgical History:  Procedure Laterality Date   ABDOMINAL HYSTERECTOMY     CHOLECYSTECTOMY     right foot surgery      Medical History: Past Medical History:  Diagnosis Date   Diabetes mellitus without complication (HCC)    Hypertension     Family History: Family History  Problem Relation Age of Onset   Breast cancer Mother 69   Breast cancer Maternal Aunt 27   Cancer Father     Social History   Socioeconomic History   Marital status: Single    Spouse name: Not on file   Number of children: Not on file   Years of education: Not on file   Highest education level: Not on file  Occupational History   Not on file  Tobacco Use   Smoking status: Never   Smokeless tobacco: Never  Vaping Use   Vaping status: Never Used  Substance and Sexual Activity   Alcohol use: Yes    Comment: social   Drug use: No   Sexual activity: Not on file  Other Topics Concern   Not on file  Social History Narrative   Not on file   Social Determinants of Health   Financial Resource Strain: Not on file  Food Insecurity: Not on file  Transportation Needs: Not on file  Physical Activity:  Not on file  Stress: Not on file  Social Connections: Not on file  Intimate Partner Violence: Not on file      Review of Systems  Constitutional:  Negative for chills, fatigue and unexpected weight change.  HENT:  Negative for congestion, postnasal drip, rhinorrhea, sneezing and sore throat.   Eyes:  Negative for redness.  Respiratory:  Negative for cough, chest tightness and shortness of breath.   Cardiovascular:  Negative for chest pain and palpitations.  Gastrointestinal:  Negative for abdominal pain, constipation, diarrhea, nausea and vomiting.  Genitourinary:  Negative for dysuria and frequency.  Musculoskeletal:  Negative for arthralgias, back pain, joint swelling  and neck pain.  Skin:  Negative for rash.  Neurological: Negative.  Negative for tremors and numbness.  Hematological:  Negative for adenopathy. Does not bruise/bleed easily.  Psychiatric/Behavioral:  Negative for behavioral problems (Depression), sleep disturbance and suicidal ideas. The patient is not nervous/anxious.     Vital Signs: BP 125/80   Pulse 85   Temp 97.8 F (36.6 C)   Resp 16   Ht 5\' 9"  (1.753 m)   Wt 212 lb 6.4 oz (96.3 kg)   SpO2 97%   BMI 31.37 kg/m    Physical Exam Vitals and nursing note reviewed.  Constitutional:      General: She is not in acute distress.    Appearance: She is well-developed. She is obese. She is not diaphoretic.  HENT:     Head: Normocephalic and atraumatic.     Mouth/Throat:     Pharynx: No oropharyngeal exudate.  Eyes:     Pupils: Pupils are equal, round, and reactive to light.  Neck:     Thyroid: No thyromegaly.     Vascular: No JVD.     Trachea: No tracheal deviation.  Cardiovascular:     Rate and Rhythm: Normal rate and regular rhythm.     Heart sounds: Normal heart sounds. No murmur heard.    No friction rub. No gallop.  Pulmonary:     Effort: Pulmonary effort is normal. No respiratory distress.     Breath sounds: No wheezing or rales.  Chest:     Chest wall: No tenderness.  Abdominal:     General: Bowel sounds are normal.     Palpations: Abdomen is soft.  Musculoskeletal:        General: Normal range of motion.     Cervical back: Normal range of motion and neck supple.  Lymphadenopathy:     Cervical: No cervical adenopathy.  Skin:    General: Skin is warm and dry.  Neurological:     Mental Status: She is alert and oriented to person, place, and time.     Cranial Nerves: No cranial nerve deficit.  Psychiatric:        Behavior: Behavior normal.        Thought Content: Thought content normal.        Judgment: Judgment normal.        Assessment/Plan: 1. Type 2 diabetes mellitus with hyperglycemia, without  long-term current use of insulin (HCC) - POCT HgB A1C is 6.9 which is increased from 6.0 last visit due to restarting at lowered mounjaro. Will titrate up again now - Urine Microalbumin w/creat. ratio - tirzepatide (MOUNJARO) 5 MG/0.5ML Pen; Inject 5 mg into the skin once a week.  Dispense: 2 mL; Refill: 2  2. Essential hypertension Stable, continue current medications  3. Mixed hyperlipidemia - Lipid Panel With LDL/HDL Ratio  4. Vitamin D deficiency -  VITAMIN D 25 Hydroxy (Vit-D Deficiency, Fractures)  5. Other fatigue - CBC w/Diff/Platelet - Comprehensive metabolic panel - TSH + free T4 - Lipid Panel With LDL/HDL Ratio - VITAMIN D 25 Hydroxy (Vit-D Deficiency, Fractures)  6. Thyroid disorder screening - TSH + free T4   General Counseling: Channelle verbalizes understanding of the findings of todays visit and agrees with plan of treatment. I have discussed any further diagnostic evaluation that may be needed or ordered today. We also reviewed her medications today. she has been encouraged to call the office with any questions or concerns that should arise related to todays visit.    Orders Placed This Encounter  Procedures   Urine Microalbumin w/creat. ratio   CBC w/Diff/Platelet   Comprehensive metabolic panel   TSH + free T4   Lipid Panel With LDL/HDL Ratio   VITAMIN D 25 Hydroxy (Vit-D Deficiency, Fractures)   POCT HgB A1C    Meds ordered this encounter  Medications   tirzepatide (MOUNJARO) 5 MG/0.5ML Pen    Sig: Inject 5 mg into the skin once a week.    Dispense:  2 mL    Refill:  2    This patient was seen by Lynn Ito, PA-C in collaboration with Dr. Beverely Risen as a part of collaborative care agreement.   Total time spent:30 Minutes Time spent includes review of chart, medications, test results, and follow up plan with the patient.      Dr Lyndon Code Internal medicine

## 2023-08-17 LAB — MICROALBUMIN / CREATININE URINE RATIO
Creatinine, Urine: 281.9 mg/dL
Microalb/Creat Ratio: 5 mg/g{creat} (ref 0–29)
Microalbumin, Urine: 12.8 ug/mL

## 2023-08-18 LAB — COLOGUARD: COLOGUARD: NEGATIVE

## 2023-08-21 ENCOUNTER — Telehealth: Payer: Self-pay

## 2023-08-21 NOTE — Telephone Encounter (Signed)
Pt notified that cologuard is negative

## 2023-08-21 NOTE — Telephone Encounter (Signed)
-----   Message from Carlean Jews sent at 08/21/2023 10:46 AM EST ----- Please let her know that her cologuard was negative

## 2023-09-20 ENCOUNTER — Telehealth: Payer: Self-pay | Admitting: Physician Assistant

## 2023-09-20 NOTE — Telephone Encounter (Signed)
Lvm for patient to return my call. Labs have not been done yet-Toni

## 2023-09-26 DIAGNOSIS — E782 Mixed hyperlipidemia: Secondary | ICD-10-CM | POA: Diagnosis not present

## 2023-09-26 DIAGNOSIS — R5383 Other fatigue: Secondary | ICD-10-CM | POA: Diagnosis not present

## 2023-09-26 DIAGNOSIS — Z1329 Encounter for screening for other suspected endocrine disorder: Secondary | ICD-10-CM | POA: Diagnosis not present

## 2023-09-26 DIAGNOSIS — E559 Vitamin D deficiency, unspecified: Secondary | ICD-10-CM | POA: Diagnosis not present

## 2023-09-27 ENCOUNTER — Encounter: Payer: Self-pay | Admitting: Physician Assistant

## 2023-09-27 ENCOUNTER — Ambulatory Visit (INDEPENDENT_AMBULATORY_CARE_PROVIDER_SITE_OTHER): Payer: BLUE CROSS/BLUE SHIELD | Admitting: Physician Assistant

## 2023-09-27 VITALS — BP 110/80 | HR 72 | Temp 98.2°F | Resp 16 | Ht 69.0 in | Wt 207.6 lb

## 2023-09-27 DIAGNOSIS — E66811 Obesity, class 1: Secondary | ICD-10-CM

## 2023-09-27 DIAGNOSIS — J4520 Mild intermittent asthma, uncomplicated: Secondary | ICD-10-CM

## 2023-09-27 DIAGNOSIS — E559 Vitamin D deficiency, unspecified: Secondary | ICD-10-CM

## 2023-09-27 DIAGNOSIS — E1165 Type 2 diabetes mellitus with hyperglycemia: Secondary | ICD-10-CM

## 2023-09-27 DIAGNOSIS — I10 Essential (primary) hypertension: Secondary | ICD-10-CM | POA: Diagnosis not present

## 2023-09-27 LAB — LIPID PANEL WITH LDL/HDL RATIO
Cholesterol, Total: 147 mg/dL (ref 100–199)
HDL: 43 mg/dL (ref >39)
LDL Chol Calc (NIH): 77 mg/dL (ref 0–99)
LDL/HDL Ratio: 1.8 {ratio} (ref 0.0–3.2)
Triglycerides: 153 mg/dL — ABNORMAL HIGH (ref 0–149)
VLDL Cholesterol Cal: 27 mg/dL (ref 5–40)

## 2023-09-27 LAB — CBC WITH DIFFERENTIAL/PLATELET
Basophils Absolute: 0.1 10*3/uL (ref 0.0–0.2)
Basos: 1 %
EOS (ABSOLUTE): 0.2 10*3/uL (ref 0.0–0.4)
Eos: 2 %
Hematocrit: 41 % (ref 34.0–46.6)
Hemoglobin: 13.7 g/dL (ref 11.1–15.9)
Immature Grans (Abs): 0 10*3/uL (ref 0.0–0.1)
Immature Granulocytes: 0 %
Lymphocytes Absolute: 3.1 10*3/uL (ref 0.7–3.1)
Lymphs: 30 %
MCH: 26.7 pg (ref 26.6–33.0)
MCHC: 33.4 g/dL (ref 31.5–35.7)
MCV: 80 fL (ref 79–97)
Monocytes Absolute: 0.6 10*3/uL (ref 0.1–0.9)
Monocytes: 6 %
Neutrophils Absolute: 6.2 10*3/uL (ref 1.4–7.0)
Neutrophils: 61 %
Platelets: 215 10*3/uL (ref 150–450)
RBC: 5.13 x10E6/uL (ref 3.77–5.28)
RDW: 13.4 % (ref 11.7–15.4)
WBC: 10.2 10*3/uL (ref 3.4–10.8)

## 2023-09-27 LAB — COMPREHENSIVE METABOLIC PANEL
ALT: 28 [IU]/L (ref 0–32)
AST: 24 [IU]/L (ref 0–40)
Albumin: 4.2 g/dL (ref 3.8–4.9)
Alkaline Phosphatase: 58 [IU]/L (ref 44–121)
BUN/Creatinine Ratio: 15 (ref 9–23)
BUN: 15 mg/dL (ref 6–24)
Bilirubin Total: 0.3 mg/dL (ref 0.0–1.2)
CO2: 28 mmol/L (ref 20–29)
Calcium: 9.6 mg/dL (ref 8.7–10.2)
Chloride: 97 mmol/L (ref 96–106)
Creatinine, Ser: 1 mg/dL (ref 0.57–1.00)
Globulin, Total: 2.7 g/dL (ref 1.5–4.5)
Glucose: 104 mg/dL — ABNORMAL HIGH (ref 70–99)
Potassium: 4 mmol/L (ref 3.5–5.2)
Sodium: 137 mmol/L (ref 134–144)
Total Protein: 6.9 g/dL (ref 6.0–8.5)
eGFR: 67 mL/min/{1.73_m2} (ref >59)

## 2023-09-27 LAB — TSH+FREE T4
Free T4: 1.47 ng/dL (ref 0.82–1.77)
TSH: 0.967 u[IU]/mL (ref 0.450–4.500)

## 2023-09-27 LAB — VITAMIN D 25 HYDROXY (VIT D DEFICIENCY, FRACTURES): Vit D, 25-Hydroxy: 27.3 ng/mL — ABNORMAL LOW (ref 30.0–100.0)

## 2023-09-27 MED ORDER — TIRZEPATIDE 7.5 MG/0.5ML ~~LOC~~ SOAJ: 7.5000 mg | SUBCUTANEOUS | 1 refills | Status: DC

## 2023-09-27 NOTE — Progress Notes (Signed)
[facility] [address] [place], [place] [address]  Internal MEDICINE  Office Visit Note  Patient Name: Jordan Mccoy  [id number]  [id number]  Date of Service: 09/27/2023  Chief Complaint  Patient presents with   Follow-up   Hypertension   Diabetes    HPI Pt is here for routine follow up -Labs reviewed: Low Vit D and will restart supplement OTC, otherwise labs look good -Doing well with mounjaro, was given 7.5mg  and has been taking this weekly. Down another 5lbs since last visit. Would like to continue with this dose since great results, and no side effects. -BG doing well -a little stressed trying to find a job, but doing ok -doing well with advair -BP stable  Current Medication: Outpatient Encounter Medications as of 09/27/2023  Medication Sig   albuterol (VENTOLIN HFA) 108 (90 Base) MCG/ACT inhaler TAKE 2 PUFFS BY MOUTH EVERY 6 HOURS AS NEEDED FOR WHEEZE OR SHORTNESS OF BREATH   amLODipine (NORVASC) 5 MG tablet Take 1 tablet (5 mg total) by mouth daily.   BD PEN NEEDLE NANO 2ND GEN 32G X 4 MM MISC USE AS DIRECTED WITH VICTOZA   bisoprolol-hydrochlorothiazide (ZIAC) 10-6.25 MG tablet Take 1 tablet by mouth daily.   dapagliflozin propanediol (FARXIGA) 10 MG TABS tablet Take 1 tablet (10 mg total) by mouth daily.   fluticasone-salmeterol (ADVAIR DISKUS) 250-50 MCG/ACT AEPB Inhale 1 puff into the lungs in the morning and at bedtime.   glucose blood (ONETOUCH VERIO) test strip Use as directed once a daily diag e11.65   glucose blood test strip Blood sugar testing done once daily - E11.65   hydrochlorothiazide (HYDRODIURIL) 12.5 MG tablet Take 1 tablet (12.5 mg total) by mouth daily.   Lancets (ONETOUCH ULTRASOFT) lancets Blood sugar testing QAM - E11.65   rosuvastatin (CRESTOR) 5 MG tablet Take 1 tablet (5 mg total) by mouth daily.   tirzepatide (MOUNJARO) 7.5 MG/0.5ML Pen Inject 7.5 mg into the skin once a week.   [DISCONTINUED] ergocalciferol (DRISDOL) 1.25 MG  (50000 UT) capsule Take one cap q week   [DISCONTINUED] tirzepatide (MOUNJARO) 5 MG/0.5ML Pen Inject 5 mg into the skin once a week.   No facility-administered encounter medications on file as of 09/27/2023.    Surgical History: Past Surgical History:  Procedure Laterality Date   ABDOMINAL HYSTERECTOMY     CHOLECYSTECTOMY     right foot surgery      Medical History: Past Medical History:  Diagnosis Date   Diabetes mellitus without complication (HCC)    Hypertension     Family History: Family History  Problem Relation Age of Onset   Breast cancer Mother 44   Breast cancer Maternal Aunt 64   Cancer Father     Social History   Socioeconomic History   Marital status: Single    Spouse name: Not on file   Number of children: Not on file   Years of education: Not on file   Highest education level: Not on file  Occupational History   Not on file  Tobacco Use   Smoking status: Never   Smokeless tobacco: Never  Vaping Use   Vaping status: Never Used  Substance and Sexual Activity   Alcohol use: Yes    Comment: social   Drug use: No   Sexual activity: Not on file  Other Topics Concern   Not on file  Social History Narrative   Not on file   Social Drivers of Health   Financial Resource Strain: Not on  file  Food Insecurity: Not on file  Transportation Needs: Not on file  Physical Activity: Not on file  Stress: Not on file  Social Connections: Not on file  Intimate Partner Violence: Not on file      Review of Systems  Constitutional:  Negative for chills, fatigue and unexpected weight change.  HENT:  Negative for congestion, postnasal drip, rhinorrhea, sneezing and sore throat.   Eyes:  Negative for redness.  Respiratory:  Negative for cough, chest tightness and shortness of breath.   Cardiovascular:  Negative for chest pain and palpitations.  Gastrointestinal:  Negative for abdominal pain, constipation, diarrhea, nausea and vomiting.  Genitourinary:   Negative for dysuria and frequency.  Musculoskeletal:  Negative for arthralgias, back pain, joint swelling and neck pain.  Skin:  Negative for rash.  Neurological: Negative.  Negative for tremors and numbness.  Hematological:  Negative for adenopathy. Does not bruise/bleed easily.  Psychiatric/Behavioral:  Negative for behavioral problems (Depression), sleep disturbance and suicidal ideas. The patient is not nervous/anxious.     Vital Signs: BP 110/80   Pulse 72   Temp 98.2 F (36.8 C)   Resp 16   Ht 5\' 9"  (1.753 m)   Wt 207 lb 9.6 oz (94.2 kg)   SpO2 96%   BMI 30.66 kg/m    Physical Exam Vitals and nursing note reviewed.  Constitutional:      General: She is not in acute distress.    Appearance: She is well-developed. She is obese. She is not diaphoretic.  HENT:     Head: Normocephalic and atraumatic.     Mouth/Throat:     Pharynx: No oropharyngeal exudate.  Eyes:     Pupils: Pupils are equal, round, and reactive to light.  Neck:     Thyroid: No thyromegaly.     Vascular: No JVD.     Trachea: No tracheal deviation.  Cardiovascular:     Rate and Rhythm: Normal rate and regular rhythm.     Heart sounds: Normal heart sounds. No murmur heard.    No friction rub. No gallop.  Pulmonary:     Effort: Pulmonary effort is normal. No respiratory distress.     Breath sounds: No wheezing or rales.  Chest:     Chest wall: No tenderness.  Abdominal:     General: Bowel sounds are normal.     Palpations: Abdomen is soft.  Musculoskeletal:        General: Normal range of motion.     Cervical back: Normal range of motion and neck supple.  Lymphadenopathy:     Cervical: No cervical adenopathy.  Skin:    General: Skin is warm and dry.  Neurological:     Mental Status: She is alert and oriented to person, place, and time.     Cranial Nerves: No cranial nerve deficit.  Psychiatric:        Behavior: Behavior normal.        Thought Content: Thought content normal.         Judgment: Judgment normal.        Assessment/Plan: 1. Type 2 diabetes mellitus with hyperglycemia, without long-term current use of insulin (HCC) (Primary) Continue current medications and working on diet and exercise - tirzepatide (MOUNJARO) 7.5 MG/0.5ML Pen; Inject 7.5 mg into the skin once a week.  Dispense: 6 mL; Refill: 1  2. Essential hypertension Well controlled, continue current medication  3. Vitamin D deficiency Start 1000IU daily  4. Mild intermittent asthma without complication Stable, continue  advair daily  5. Obesity (BMI 30.0-34.9) Down 5lbs since last visit and will continue to work on diet/exercise and may continue mounjaro for BG and wt loss benefits   General Counseling: Jordan Mccoy verbalizes understanding of the findings of todays visit and agrees with plan of treatment. I have discussed any further diagnostic evaluation that may be needed or ordered today. We also reviewed her medications today. she has been encouraged to call the office with any questions or concerns that should arise related to todays visit.    No orders of the defined types were placed in this encounter.   Meds ordered this encounter  Medications   tirzepatide (MOUNJARO) 7.5 MG/0.5ML Pen    Sig: Inject 7.5 mg into the skin once a week.    Dispense:  6 mL    Refill:  1    This patient was seen by Lynn Ito, PA-C in collaboration with Dr. Beverely Risen as a part of collaborative care agreement.   Total time spent:30 Minutes Time spent includes review of chart, medications, test results, and follow up plan with the patient.      Dr Lyndon Code Internal medicine

## 2023-11-29 ENCOUNTER — Ambulatory Visit: Payer: BLUE CROSS/BLUE SHIELD | Admitting: Physician Assistant

## 2024-01-03 ENCOUNTER — Ambulatory Visit (INDEPENDENT_AMBULATORY_CARE_PROVIDER_SITE_OTHER): Payer: BLUE CROSS/BLUE SHIELD | Admitting: Physician Assistant

## 2024-01-03 ENCOUNTER — Encounter: Payer: Self-pay | Admitting: Physician Assistant

## 2024-01-03 VITALS — BP 120/80 | HR 70 | Temp 98.0°F | Resp 16 | Ht 69.0 in | Wt 204.0 lb

## 2024-01-03 DIAGNOSIS — E66811 Obesity, class 1: Secondary | ICD-10-CM

## 2024-01-03 DIAGNOSIS — E1165 Type 2 diabetes mellitus with hyperglycemia: Secondary | ICD-10-CM | POA: Diagnosis not present

## 2024-01-03 DIAGNOSIS — I10 Essential (primary) hypertension: Secondary | ICD-10-CM | POA: Diagnosis not present

## 2024-01-03 LAB — POCT GLYCOSYLATED HEMOGLOBIN (HGB A1C): Hemoglobin A1C: 6.1 % — AB (ref 4.0–5.6)

## 2024-01-03 MED ORDER — BISOPROLOL-HYDROCHLOROTHIAZIDE 10-6.25 MG PO TABS: 1.0000 | ORAL_TABLET | Freq: Every day | ORAL | 1 refills | Status: DC

## 2024-01-03 MED ORDER — TIRZEPATIDE 7.5 MG/0.5ML ~~LOC~~ SOAJ: 7.5000 mg | SUBCUTANEOUS | 1 refills | Status: DC

## 2024-01-03 NOTE — Progress Notes (Signed)
 [facility] [address] [place], [place] [address]  Internal MEDICINE  Office Visit Note  Patient Name: Jordan Mccoy  [id number]  [id number]  Date of Service: 01/03/2024  Chief Complaint  Patient presents with   Follow-up   Diabetes   Hypertension    HPI Pt is here for routine follow up -Started new job, works in [employer] in [place] now which is different for her. 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   BD PEN NEEDLE NANO 2ND GEN 32G X 4 MM MISC USE AS DIRECTED WITH VICTOZA   dapagliflozin propanediol (FARXIGA) 10 MG TABS tablet Take 1 tablet (10 mg total) by mouth daily.   fluticasone-salmeterol (ADVAIR DISKUS) 250-50 MCG/ACT AEPB Inhale 1 puff into the lungs in the morning and at bedtime.   glucose blood (ONETOUCH VERIO) test strip Use as directed once a daily diag e11.65   glucose blood test strip Blood sugar testing done once daily - E11.65   hydrochlorothiazide (HYDRODIURIL) 12.5 MG tablet Take 1 tablet (12.5 mg total) by mouth daily.   Lancets (ONETOUCH ULTRASOFT) lancets Blood sugar testing QAM - E11.65   rosuvastatin (CRESTOR) 5 MG tablet Take 1 tablet (5 mg total) by mouth daily.   [DISCONTINUED] bisoprolol-hydrochlorothiazide (ZIAC) 10-6.25 MG tablet Take 1 tablet by mouth daily.   [DISCONTINUED] tirzepatide (MOUNJARO) 7.5 MG/0.5ML Pen Inject 7.5 mg into the skin once a week.   bisoprolol-hydrochlorothiazide (ZIAC) 10-6.25 MG tablet Take 1 tablet by mouth daily.   tirzepatide  (MOUNJARO) 7.5 MG/0.5ML Pen Inject 7.5 mg into the skin once a week.   No facility-administered encounter medications on file as of 01/03/2024.    Surgical History: Past Surgical History:  Procedure Laterality Date   ABDOMINAL HYSTERECTOMY     CHOLECYSTECTOMY     right foot surgery      Medical History: Past Medical History:  Diagnosis Date   Diabetes mellitus without complication (HCC)    Hypertension     Family History: Family History  Problem Relation Age of Onset   Breast cancer Mother 54   Breast cancer Maternal Aunt 93   Cancer Father     Social History   Socioeconomic History   Marital status: Single    Spouse name: Not on file   Number of children: Not on file   Years of education: Not on file   Highest education level: Not on file  Occupational History   Not on file  Tobacco Use   Smoking status: Never   Smokeless tobacco: Never  Vaping Use   Vaping status: Never Used  Substance and Sexual Activity   Alcohol use: Yes    Comment: social   Drug use: No   Sexual activity: Not on file  Other Topics Concern   Not on file  Social History Narrative   Not on file   Social Drivers of Health   Financial Resource Strain: Not on file  Food Insecurity: Not on file  Transportation Needs: Not on file  Physical Activity: Not on file  Stress: Not on file  Social Connections: Not on file  Intimate Partner Violence: Not on file      Review of Systems  Constitutional:  Negative for chills, fatigue and unexpected weight change.  HENT:  Negative for congestion, postnasal drip, rhinorrhea, sneezing and sore throat.   Eyes:  Negative for redness.  Respiratory:  Negative for cough, chest tightness and shortness of breath.   Cardiovascular:  Negative for chest pain and palpitations.  Gastrointestinal:  Negative for abdominal pain, constipation, diarrhea, nausea and vomiting.  Genitourinary:  Negative for dysuria and frequency.  Musculoskeletal:  Negative for  arthralgias, back pain, joint swelling and neck pain.  Skin:  Negative for rash.  Neurological: Negative.  Negative for tremors and numbness.  Hematological:  Negative for adenopathy. Does not bruise/bleed easily.  Psychiatric/Behavioral:  Negative for behavioral problems (Depression), sleep disturbance and suicidal ideas.     Vital Signs: BP 120/80   Pulse 70   Temp 98 F (36.7 C)   Resp 16   Ht 5\' 9"  (1.753 m)   Wt 204 lb (92.5 kg)   SpO2 98%   BMI 30.13 kg/m    Physical Exam Vitals and nursing note reviewed.  Constitutional:      General: She is not in acute distress.    Appearance: She is well-developed. She is obese. She is not diaphoretic.  HENT:     Head: Normocephalic and atraumatic.  Eyes:     Pupils: Pupils are equal, round, and reactive to light.  Neck:     Thyroid: No thyromegaly.     Vascular: No JVD.     Trachea: No tracheal deviation.  Cardiovascular:     Rate and Rhythm: Normal rate and regular rhythm.     Heart sounds: Normal heart sounds. No murmur heard.    No friction rub. No gallop.  Pulmonary:     Effort: Pulmonary effort is normal. No respiratory distress.     Breath sounds: No wheezing or rales.  Chest:     Chest wall: No tenderness.  Musculoskeletal:        General: Normal range of motion.     Cervical back: Normal range of motion and neck supple.  Lymphadenopathy:     Cervical: No cervical adenopathy.  Skin:    General: Skin is warm and dry.  Neurological:     Mental Status: She is alert.  Psychiatric:        Behavior: Behavior normal.        Thought Content: Thought content normal.        Judgment: Judgment normal.        Assessment/Plan: 1. Type 2 diabetes mellitus with hyperglycemia, without long-term current use of insulin (HCC) (Primary) - POCT HgB A1C is 6.1 which is improved from 6.9 last visit. Will continue current medication and working on diet and exercise - tirzepatide (MOUNJARO) 7.5 MG/0.5ML Pen; Inject 7.5 mg into  the skin once a week.  Dispense: 6 mL; Refill: 1  2. Essential hypertension Stable, continue current medication - bisoprolol-hydrochlorothiazide (ZIAC) 10-6.25 MG tablet; Take 1 tablet by mouth daily.  Dispense: 90 tablet; Refill: 1  3. Obesity (BMI 30.0-34.9) Down 3lbs since last visit, will continue to work on diet and exercise   General Counseling: mahayla haddaway understanding of the findings of todays visit and agrees with plan of treatment. I have discussed any further diagnostic evaluation that may be needed or ordered today. We also reviewed her medications  today. she has been encouraged to call the office with any questions or concerns that should arise related to todays visit.    Orders Placed This Encounter  Procedures   POCT HgB A1C    Meds ordered this encounter  Medications   bisoprolol-hydrochlorothiazide (ZIAC) 10-6.25 MG tablet    Sig: Take 1 tablet by mouth daily.    Dispense:  90 tablet    Refill:  1   tirzepatide (MOUNJARO) 7.5 MG/0.5ML Pen    Sig: Inject 7.5 mg into the skin once a week.    Dispense:  6 mL    Refill:  1    This patient was seen by Lynn Ito, PA-C in collaboration with Dr. Beverely Risen as a part of collaborative care agreement.   Total time spent:30 Minutes Time spent includes review of chart, medications, test results, and follow up plan with the patient.      Dr Lyndon Code Internal medicine

## 2024-04-01 ENCOUNTER — Other Ambulatory Visit: Payer: Self-pay | Admitting: Physician Assistant

## 2024-04-01 DIAGNOSIS — I10 Essential (primary) hypertension: Secondary | ICD-10-CM

## 2024-04-01 DIAGNOSIS — E782 Mixed hyperlipidemia: Secondary | ICD-10-CM

## 2024-04-03 ENCOUNTER — Ambulatory Visit (INDEPENDENT_AMBULATORY_CARE_PROVIDER_SITE_OTHER): Admitting: Physician Assistant

## 2024-04-03 ENCOUNTER — Encounter: Payer: Self-pay | Admitting: Physician Assistant

## 2024-04-03 VITALS — BP 122/86 | HR 66 | Temp 98.3°F | Resp 16 | Ht 69.0 in | Wt 206.2 lb

## 2024-04-03 DIAGNOSIS — Z1231 Encounter for screening mammogram for malignant neoplasm of breast: Secondary | ICD-10-CM | POA: Diagnosis not present

## 2024-04-03 DIAGNOSIS — E1165 Type 2 diabetes mellitus with hyperglycemia: Secondary | ICD-10-CM | POA: Diagnosis not present

## 2024-04-03 DIAGNOSIS — I10 Essential (primary) hypertension: Secondary | ICD-10-CM | POA: Diagnosis not present

## 2024-04-03 LAB — POCT GLYCOSYLATED HEMOGLOBIN (HGB A1C): Hemoglobin A1C: 6.1 % — AB (ref 4.0–5.6)

## 2024-04-03 MED ORDER — TIRZEPATIDE 7.5 MG/0.5ML ~~LOC~~ SOAJ: 7.5000 mg | SUBCUTANEOUS | 1 refills | Status: DC

## 2024-04-03 NOTE — Progress Notes (Signed)
 [facility] [address] [place], [place] [address]  Internal MEDICINE  Office Visit Note  Patient Name: Jordan Mccoy  [id number]  [id number]  Date of Service: 04/03/2024  Chief Complaint  Patient presents with   Diabetes   Hypertension   Follow-up    HPI Pt is here for routine follow up -no concerns today -BG not checked often, but A1c stable. Doing well with mounjaro  and needs refill -BP stable -starting to like new job now that she is getting more comfortable -still needs to schedule mammogram and eye exam, had missed due to insurance change  Current Medication: Outpatient Encounter Medications as of 04/03/2024  Medication Sig   albuterol  (VENTOLIN  HFA) 108 (90 Base) MCG/ACT inhaler TAKE 2 PUFFS BY MOUTH EVERY 6 HOURS AS NEEDED FOR WHEEZE OR SHORTNESS OF BREATH   amLODipine  (NORVASC ) 5 MG tablet TAKE 1 TABLET (5 MG TOTAL) BY MOUTH DAILY.   BD PEN NEEDLE NANO 2ND GEN 32G X 4 MM MISC USE AS DIRECTED WITH VICTOZA    bisoprolol -hydrochlorothiazide  (ZIAC ) 10-6.25 MG tablet Take 1 tablet by mouth daily.   dapagliflozin  propanediol (FARXIGA ) 10 MG TABS tablet Take 1 tablet (10 mg total) by mouth daily.   fluticasone -salmeterol (ADVAIR DISKUS) 250-50 MCG/ACT AEPB Inhale 1 puff into the lungs in the morning and at bedtime.   glucose blood (ONETOUCH VERIO) test strip Use as directed once a daily diag e11.65   glucose blood test strip Blood sugar testing done once daily - E11.65   hydrochlorothiazide  (HYDRODIURIL ) 12.5 MG tablet Take 1 tablet (12.5 mg total) by mouth daily.   Lancets (ONETOUCH ULTRASOFT) lancets Blood sugar testing QAM - E11.65   rosuvastatin  (CRESTOR ) 5 MG tablet TAKE 1 TABLET (5 MG TOTAL) BY MOUTH DAILY.   tirzepatide  (MOUNJARO ) 7.5 MG/0.5ML Pen Inject 7.5 mg into the skin once a week.   [DISCONTINUED] tirzepatide  (MOUNJARO ) 7.5 MG/0.5ML Pen Inject 7.5 mg into the skin once a week.   No facility-administered encounter medications on file as of  04/03/2024.    Surgical History: Past Surgical History:  Procedure Laterality Date   ABDOMINAL HYSTERECTOMY     CHOLECYSTECTOMY     right foot surgery      Medical History: Past Medical History:  Diagnosis Date   Diabetes mellitus without complication (HCC)    Hypertension     Family History: Family History  Problem Relation Age of Onset   Breast cancer Mother 23   Breast cancer Maternal Aunt 73   Cancer Father     Social History   Socioeconomic History   Marital status: Single    Spouse name: Not on file   Number of children: Not on file   Years of education: Not on file   Highest education level: Not on file  Occupational History   Not on file  Tobacco Use   Smoking status: Never   Smokeless tobacco: Never  Vaping Use   Vaping status: Never Used  Substance and Sexual Activity   Alcohol use: Yes    Comment: social   Drug use: No   Sexual activity: Not on file  Other Topics Concern   Not on file  Social History Narrative   Not on file   Social Drivers of Health   Financial Resource Strain: Not on file  Food Insecurity: Not on file  Transportation Needs: Not on file  Physical Activity: Not on file  Stress: Not on file  Social Connections: Not on file  Intimate Partner Violence: Not on file  Review of Systems  Constitutional:  Negative for chills, fatigue and unexpected weight change.  HENT:  Negative for congestion, postnasal drip, rhinorrhea, sneezing and sore throat.   Eyes:  Negative for redness.  Respiratory:  Negative for cough, chest tightness and shortness of breath.   Cardiovascular:  Negative for chest pain and palpitations.  Gastrointestinal:  Negative for abdominal pain, constipation, diarrhea, nausea and vomiting.  Genitourinary:  Negative for dysuria and frequency.  Musculoskeletal:  Negative for arthralgias, back pain, joint swelling and neck pain.  Skin:  Negative for rash.  Neurological: Negative.  Negative for tremors and  numbness.  Hematological:  Negative for adenopathy. Does not bruise/bleed easily.  Psychiatric/Behavioral:  Negative for behavioral problems (Depression), sleep disturbance and suicidal ideas.     Vital Signs: BP 122/86   Pulse 66   Temp 98.3 F (36.8 C)   Resp 16   Ht 5' 9 (1.753 m)   Wt 206 lb 3.2 oz (93.5 kg)   SpO2 99%   BMI 30.45 kg/m    Physical Exam Vitals and nursing note reviewed.  Constitutional:      General: She is not in acute distress.    Appearance: She is well-developed. She is obese. She is not diaphoretic.  HENT:     Head: Normocephalic and atraumatic.   Eyes:     Pupils: Pupils are equal, round, and reactive to light.   Neck:     Thyroid : No thyromegaly.     Vascular: No JVD.     Trachea: No tracheal deviation.   Cardiovascular:     Rate and Rhythm: Normal rate and regular rhythm.     Heart sounds: Normal heart sounds. No murmur heard.    No friction rub. No gallop.  Pulmonary:     Effort: Pulmonary effort is normal. No respiratory distress.     Breath sounds: No wheezing or rales.  Chest:     Chest wall: No tenderness.   Musculoskeletal:        General: Normal range of motion.   Skin:    General: Skin is warm and dry.   Neurological:     Mental Status: She is alert.   Psychiatric:        Behavior: Behavior normal.        Thought Content: Thought content normal.        Judgment: Judgment normal.        Assessment/Plan: 1. Type 2 diabetes mellitus with hyperglycemia, without long-term current use of insulin  (HCC) (Primary) - POCT glycosylated hemoglobin (Hb A1C) is 6.1 which is stable, continue mounjaro  and schedule eye exam - tirzepatide  (MOUNJARO ) 7.5 MG/0.5ML Pen; Inject 7.5 mg into the skin once a week.  Dispense: 6 mL; Refill: 1  2. Essential hypertension Stable, continue current medication  3. Visit for screening mammogra - MM 3D SCREENING MAMMOGRAM BILATERAL BREAST; Future   General Counseling: Aaliyana verbalizes  understanding of the findings of todays visit and agrees with plan of treatment. I have discussed any further diagnostic evaluation that may be needed or ordered today. We also reviewed her medications today. she has been encouraged to call the office with any questions or concerns that should arise related to todays visit.    Orders Placed This Encounter  Procedures   MM 3D SCREENING MAMMOGRAM BILATERAL BREAST   POCT glycosylated hemoglobin (Hb A1C)    Meds ordered this encounter  Medications   tirzepatide  (MOUNJARO ) 7.5 MG/0.5ML Pen    Sig: Inject 7.5 mg into the  skin once a week.    Dispense:  6 mL    Refill:  1    This patient was seen by Tinnie Pro, PA-C in collaboration with Dr. Sigrid Bathe as a part of collaborative care agreement.   Total time spent:30 Minutes Time spent includes review of chart, medications, test results, and follow up plan with the patient.      Dr Fozia M Khan Internal medicine

## 2024-04-10 DIAGNOSIS — Z1231 Encounter for screening mammogram for malignant neoplasm of breast: Secondary | ICD-10-CM | POA: Diagnosis not present

## 2024-05-02 DIAGNOSIS — E119 Type 2 diabetes mellitus without complications: Secondary | ICD-10-CM | POA: Diagnosis not present

## 2024-06-29 ENCOUNTER — Other Ambulatory Visit: Payer: Self-pay | Admitting: Physician Assistant

## 2024-06-29 DIAGNOSIS — I10 Essential (primary) hypertension: Secondary | ICD-10-CM

## 2024-07-13 ENCOUNTER — Other Ambulatory Visit: Payer: Self-pay | Admitting: Physician Assistant

## 2024-07-13 DIAGNOSIS — J4520 Mild intermittent asthma, uncomplicated: Secondary | ICD-10-CM

## 2024-07-14 ENCOUNTER — Other Ambulatory Visit: Payer: Self-pay

## 2024-07-14 DIAGNOSIS — J4520 Mild intermittent asthma, uncomplicated: Secondary | ICD-10-CM

## 2024-07-14 MED ORDER — FLUTICASONE-SALMETEROL 250-50 MCG/ACT IN AEPB: 1.0000 | INHALATION_SPRAY | Freq: Two times a day (BID) | RESPIRATORY_TRACT | 3 refills | Status: DC

## 2024-07-17 ENCOUNTER — Encounter: Payer: BLUE CROSS/BLUE SHIELD | Admitting: Physician Assistant

## 2024-08-08 ENCOUNTER — Telehealth: Payer: Self-pay | Admitting: Physician Assistant

## 2024-08-08 NOTE — Telephone Encounter (Signed)
 Left vm and sent mychart message to confirm 08/15/24 appointment-Toni

## 2024-08-15 ENCOUNTER — Encounter: Payer: Self-pay | Admitting: Physician Assistant

## 2024-08-15 ENCOUNTER — Ambulatory Visit (INDEPENDENT_AMBULATORY_CARE_PROVIDER_SITE_OTHER): Admitting: Physician Assistant

## 2024-08-15 VITALS — BP 130/80 | HR 61 | Temp 97.5°F | Resp 16 | Ht 69.0 in | Wt 211.6 lb

## 2024-08-15 DIAGNOSIS — E782 Mixed hyperlipidemia: Secondary | ICD-10-CM | POA: Diagnosis not present

## 2024-08-15 DIAGNOSIS — J4520 Mild intermittent asthma, uncomplicated: Secondary | ICD-10-CM | POA: Diagnosis not present

## 2024-08-15 DIAGNOSIS — R5383 Other fatigue: Secondary | ICD-10-CM

## 2024-08-15 DIAGNOSIS — E1165 Type 2 diabetes mellitus with hyperglycemia: Secondary | ICD-10-CM

## 2024-08-15 DIAGNOSIS — Z0001 Encounter for general adult medical examination with abnormal findings: Secondary | ICD-10-CM | POA: Diagnosis not present

## 2024-08-15 DIAGNOSIS — E559 Vitamin D deficiency, unspecified: Secondary | ICD-10-CM

## 2024-08-15 DIAGNOSIS — I10 Essential (primary) hypertension: Secondary | ICD-10-CM | POA: Diagnosis not present

## 2024-08-15 DIAGNOSIS — R300 Dysuria: Secondary | ICD-10-CM

## 2024-08-15 DIAGNOSIS — Z1329 Encounter for screening for other suspected endocrine disorder: Secondary | ICD-10-CM

## 2024-08-15 MED ORDER — TIRZEPATIDE 7.5 MG/0.5ML ~~LOC~~ SOAJ: 7.5000 mg | SUBCUTANEOUS | 2 refills | Status: AC

## 2024-08-15 NOTE — Progress Notes (Signed)
 [facility] [address] [place], [place] [address]  Internal MEDICINE  Office Visit Note  Patient Name: Jordan Mccoy  [id number]  [id number]  Date of Service: 08/15/2024  Chief Complaint  Patient presents with   Diabetes   Hypertension   Annual Exam     HPI Pt is here for routine health maintenance examination -UTD mammogram and eye exam -using wixela inhaler due to change from advair -BP stable -due for labs -doing well on mounjaro  and would like to continue  Current Medication: Outpatient Encounter Medications as of 08/15/2024  Medication Sig   fluticasone -salmeterol (WIXELA INHUB) 250-50 MCG/ACT AEPB Inhale 1 puff into the lungs in the morning and at bedtime.   albuterol  (VENTOLIN  HFA) 108 (90 Base) MCG/ACT inhaler TAKE 2 PUFFS BY MOUTH EVERY 6 HOURS AS NEEDED FOR WHEEZE OR SHORTNESS OF BREATH   amLODipine  (NORVASC ) 5 MG tablet TAKE 1 TABLET (5 MG TOTAL) BY MOUTH DAILY.   BD PEN NEEDLE NANO 2ND GEN 32G X 4 MM MISC USE AS DIRECTED WITH VICTOZA    bisoprolol -hydrochlorothiazide  (ZIAC ) 10-6.25 MG tablet TAKE 1 TABLET BY MOUTH EVERY DAY   dapagliflozin  propanediol (FARXIGA ) 10 MG TABS tablet Take 1 tablet (10 mg total) by mouth daily.   hydrochlorothiazide  (HYDRODIURIL ) 12.5 MG tablet Take 1 tablet (12.5 mg total) by mouth daily.   Lancets (ONETOUCH ULTRASOFT) lancets Blood sugar testing QAM - E11.65   rosuvastatin  (CRESTOR ) 5 MG tablet TAKE 1 TABLET (5 MG TOTAL) BY MOUTH DAILY.   tirzepatide  (MOUNJARO ) 7.5 MG/0.5ML Pen Inject 7.5 mg into the skin once a week.   [DISCONTINUED] fluticasone -salmeterol (ADVAIR DISKUS) 250-50 MCG/ACT AEPB Inhale 1 puff into the lungs in the morning and at bedtime. (Patient not taking: Reported on 08/15/2024)   [DISCONTINUED] glucose blood (ONETOUCH VERIO) test strip Use as directed once a daily diag e11.65 (Patient not taking: Reported on 08/15/2024)   [DISCONTINUED] glucose blood test strip Blood sugar testing done once daily - E11.65  (Patient not taking: Reported on 08/15/2024)   [DISCONTINUED] tirzepatide  (MOUNJARO ) 7.5 MG/0.5ML Pen Inject 7.5 mg into the skin once a week.   No facility-administered encounter medications on file as of 08/15/2024.    Surgical History: Past Surgical History:  Procedure Laterality Date   ABDOMINAL HYSTERECTOMY     CHOLECYSTECTOMY     right foot surgery      Medical History: Past Medical History:  Diagnosis Date   Diabetes mellitus without complication (HCC)    Hypertension     Family History: Family History  Problem Relation Age of Onset   Breast cancer Mother 49   Breast cancer Maternal Aunt 68   Cancer Father       Review of Systems  Constitutional:  Negative for chills, fatigue and unexpected weight change.  HENT:  Negative for congestion, postnasal drip, rhinorrhea, sneezing and sore throat.   Eyes:  Negative for redness.  Respiratory:  Negative for cough, chest tightness and shortness of breath.   Cardiovascular:  Negative for chest pain and palpitations.  Gastrointestinal:  Negative for abdominal pain, constipation, diarrhea, nausea and vomiting.  Genitourinary:  Negative for dysuria and frequency.  Musculoskeletal:  Negative for arthralgias, back pain, joint swelling and neck pain.  Skin:  Negative for rash.  Neurological: Negative.  Negative for tremors and numbness.  Hematological:  Negative for adenopathy. Does not bruise/bleed easily.  Psychiatric/Behavioral:  Negative for behavioral problems (Depression), sleep disturbance and suicidal ideas.      Vital Signs: BP 130/80   Pulse  61   Temp (!) 97.5 F (36.4 C)   Resp 16   Ht 5' 9 (1.753 m)   Wt 211 lb 9.6 oz (96 kg)   SpO2 99%   BMI 31.25 kg/m    Physical Exam Vitals and nursing note reviewed.  Constitutional:      General: She is not in acute distress.    Appearance: She is well-developed. She is obese. She is not diaphoretic.  HENT:     Head: Normocephalic and atraumatic.      Mouth/Throat:     Pharynx: No oropharyngeal exudate.  Eyes:     Pupils: Pupils are equal, round, and reactive to light.  Neck:     Thyroid : No thyromegaly.     Vascular: No JVD.     Trachea: No tracheal deviation.  Cardiovascular:     Rate and Rhythm: Normal rate and regular rhythm.     Pulses:          Dorsalis pedis pulses are 3+ on the right side and 3+ on the left side.       Posterior tibial pulses are 3+ on the right side and 3+ on the left side.     Heart sounds: Normal heart sounds. No murmur heard.    No friction rub. No gallop.  Pulmonary:     Effort: Pulmonary effort is normal. No respiratory distress.     Breath sounds: No wheezing or rales.  Chest:     Chest wall: No tenderness.  Abdominal:     General: Bowel sounds are normal.     Palpations: Abdomen is soft.     Tenderness: There is no abdominal tenderness.  Musculoskeletal:        General: Normal range of motion.     Cervical back: Normal range of motion and neck supple.     Right foot: Normal range of motion.     Left foot: Normal range of motion.  Feet:     Right foot:     Protective Sensation: 2 sites tested.  2 sites sensed.     Skin integrity: Skin integrity normal.     Toenail Condition: Right toenails are normal.     Left foot:     Protective Sensation: 2 sites tested.  2 sites sensed.     Skin integrity: Skin integrity normal.     Toenail Condition: Left toenails are normal.  Lymphadenopathy:     Cervical: No cervical adenopathy.  Skin:    General: Skin is warm and dry.  Neurological:     Mental Status: She is alert and oriented to person, place, and time.     Cranial Nerves: No cranial nerve deficit.  Psychiatric:        Behavior: Behavior normal.        Thought Content: Thought content normal.        Judgment: Judgment normal.      LABS: Recent Results (from the past 2160 hours)  UA/M w/rflx Culture, Routine     Status: Abnormal   Collection Time: 08/15/24 11:30 AM   Specimen: Urine    Urine  Result Value Ref Range   Specific Gravity, UA 1.026 1.005 - 1.030   pH, UA 5.5 5.0 - 7.5   Color, UA Yellow Yellow   Appearance Ur Turbid (A) Clear   Leukocytes,UA Negative Negative   Protein,UA Negative Negative/Trace   Glucose, UA Negative Negative   Ketones, UA Negative Negative   RBC, UA Negative Negative   Bilirubin, UA  Negative Negative   Urobilinogen, Ur 0.2 0.2 - 1.0 mg/dL   Nitrite, UA Negative Negative   Microscopic Examination Comment     Comment: Microscopic follows if indicated.   Microscopic Examination See below:     Comment: Microscopic was indicated and was performed.   Urinalysis Reflex Comment     Comment: This specimen will not reflex to a Urine Culture.  Microscopic Examination     Status: Abnormal   Collection Time: 08/15/24 11:30 AM   Urine  Result Value Ref Range   WBC, UA 0-5 0 - 5 /hpf   RBC, Urine 0-2 0 - 2 /hpf   Epithelial Cells (non renal) >10 (A) 0 - 10 /hpf   Casts None seen None seen /lpf   Bacteria, UA Few None seen/Few       Assessment/Plan: 1. Encounter for general adult medical examination with abnormal findings (Primary) CPE performed, labs ordered  2. Type 2 diabetes mellitus with hyperglycemia, without long-term current use of insulin  (HCC) Will continue current medications and will order A1c with labs for monitoring - Hgb A1C w/o eAG - tirzepatide  (MOUNJARO ) 7.5 MG/0.5ML Pen; Inject 7.5 mg into the skin once a week.  Dispense: 2 mL; Refill: 2  3. Essential hypertension Controlled, continue current medications  4. Mild intermittent asthma without complication Stable, doing well with wixela  5. Mixed hyperlipidemia Continue crestor  and will update labs - Lipid Panel With LDL/HDL Ratio  6. Vitamin D  deficiency - VITAMIN D  25 Hydroxy (Vit-D Deficiency, Fractures)  7. Thyroid  disorder screen - TSH + free T4  8. Other fatigue - CBC w/Diff/Platelet - Comprehensive metabolic panel with GFR - TSH + free T4 -  Lipid Panel With LDL/HDL Ratio - Hgb A1C w/o eAG - VITAMIN D  25 Hydroxy (Vit-D Deficiency, Fractures)  9. Dysuria - UA/M w/rflx Culture, Routine   General Counseling: Michael verbalizes understanding of the findings of todays visit and agrees with plan of treatment. I have discussed any further diagnostic evaluation that may be needed or ordered today. We also reviewed her medications today. she has been encouraged to call the office with any questions or concerns that should arise related to todays visit.    Counseling:    Orders Placed This Encounter  Procedures   Microscopic Examination   CBC w/Diff/Platelet   Comprehensive metabolic panel with GFR   TSH + free T4   Lipid Panel With LDL/HDL Ratio   Hgb A1C w/o eAG   VITAMIN D  25 Hydroxy (Vit-D Deficiency, Fractures)   UA/M w/rflx Culture, Routine    Meds ordered this encounter  Medications   tirzepatide  (MOUNJARO ) 7.5 MG/0.5ML Pen    Sig: Inject 7.5 mg into the skin once a week.    Dispense:  2 mL    Refill:  2    This patient was seen by Tinnie Pro, PA-C in collaboration with Dr. Sigrid Bathe as a part of collaborative care agreement.  Total time spent:35 Minutes  Time spent includes review of chart, medications, test results, and follow up plan with the patient.     Sigrid CHRISTELLA Bathe, MD  Internal Medicine

## 2024-08-16 LAB — UA/M W/RFLX CULTURE, ROUTINE
Bilirubin, UA: NEGATIVE
Glucose, UA: NEGATIVE
Ketones, UA: NEGATIVE
Leukocytes,UA: NEGATIVE
Nitrite, UA: NEGATIVE
Protein,UA: NEGATIVE
RBC, UA: NEGATIVE
Specific Gravity, UA: 1.026 (ref 1.005–1.030)
Urobilinogen, Ur: 0.2 mg/dL (ref 0.2–1.0)
pH, UA: 5.5 (ref 5.0–7.5)

## 2024-08-16 LAB — MICROSCOPIC EXAMINATION
Casts: NONE SEEN /LPF
Epithelial Cells (non renal): >10 /HPF — AB (ref 0–10)

## 2024-08-31 ENCOUNTER — Other Ambulatory Visit: Payer: Self-pay | Admitting: Physician Assistant

## 2024-08-31 DIAGNOSIS — I10 Essential (primary) hypertension: Secondary | ICD-10-CM

## 2024-09-02 DIAGNOSIS — E559 Vitamin D deficiency, unspecified: Secondary | ICD-10-CM | POA: Diagnosis not present

## 2024-09-02 DIAGNOSIS — R5383 Other fatigue: Secondary | ICD-10-CM | POA: Diagnosis not present

## 2024-09-02 DIAGNOSIS — E1165 Type 2 diabetes mellitus with hyperglycemia: Secondary | ICD-10-CM | POA: Diagnosis not present

## 2024-09-02 DIAGNOSIS — Z1329 Encounter for screening for other suspected endocrine disorder: Secondary | ICD-10-CM | POA: Diagnosis not present

## 2024-09-03 LAB — TSH+FREE T4
Free T4: 1.34 ng/dL (ref 0.82–1.77)
TSH: 1.45 u[IU]/mL (ref 0.450–4.500)

## 2024-09-03 LAB — LIPID PANEL WITH LDL/HDL RATIO
Cholesterol, Total: 132 mg/dL (ref 100–199)
HDL: 47 mg/dL (ref >39)
LDL Chol Calc (NIH): 63 mg/dL (ref 0–99)
LDL/HDL Ratio: 1.3 ratio (ref 0.0–3.2)
Triglycerides: 127 mg/dL (ref 0–149)
VLDL Cholesterol Cal: 22 mg/dL (ref 5–40)

## 2024-09-03 LAB — CBC WITH DIFFERENTIAL/PLATELET
Basophils Absolute: 0.1 x10E3/uL (ref 0.0–0.2)
Basos: 1 %
EOS (ABSOLUTE): 0.2 x10E3/uL (ref 0.0–0.4)
Eos: 2 %
Hematocrit: 42.1 % (ref 34.0–46.6)
Hemoglobin: 13.5 g/dL (ref 11.1–15.9)
Immature Grans (Abs): 0 x10E3/uL (ref 0.0–0.1)
Immature Granulocytes: 0 %
Lymphocytes Absolute: 3.3 x10E3/uL — ABNORMAL HIGH (ref 0.7–3.1)
Lymphs: 27 %
MCH: 26.4 pg — ABNORMAL LOW (ref 26.6–33.0)
MCHC: 32.1 g/dL (ref 31.5–35.7)
MCV: 82 fL (ref 79–97)
Monocytes Absolute: 0.8 x10E3/uL (ref 0.1–0.9)
Monocytes: 7 %
Neutrophils Absolute: 7.8 x10E3/uL — ABNORMAL HIGH (ref 1.4–7.0)
Neutrophils: 63 %
Platelets: 225 x10E3/uL (ref 150–450)
RBC: 5.11 x10E6/uL (ref 3.77–5.28)
RDW: 13 % (ref 11.7–15.4)
WBC: 12.3 x10E3/uL — ABNORMAL HIGH (ref 3.4–10.8)

## 2024-09-03 LAB — VITAMIN D 25 HYDROXY (VIT D DEFICIENCY, FRACTURES): Vit D, 25-Hydroxy: 21 ng/mL — ABNORMAL LOW (ref 30.0–100.0)

## 2024-09-03 LAB — COMPREHENSIVE METABOLIC PANEL WITH GFR
ALT: 21 IU/L (ref 0–32)
AST: 19 IU/L (ref 0–40)
Albumin: 4.3 g/dL (ref 3.8–4.9)
Alkaline Phosphatase: 63 IU/L (ref 49–135)
BUN/Creatinine Ratio: 16 (ref 9–23)
BUN: 16 mg/dL (ref 6–24)
Bilirubin Total: 0.3 mg/dL (ref 0.0–1.2)
CO2: 27 mmol/L (ref 20–29)
Calcium: 10 mg/dL (ref 8.7–10.2)
Chloride: 101 mmol/L (ref 96–106)
Creatinine, Ser: 1.02 mg/dL — ABNORMAL HIGH (ref 0.57–1.00)
Globulin, Total: 2.4 g/dL (ref 1.5–4.5)
Glucose: 121 mg/dL — ABNORMAL HIGH (ref 70–99)
Potassium: 4.4 mmol/L (ref 3.5–5.2)
Sodium: 141 mmol/L (ref 134–144)
Total Protein: 6.7 g/dL (ref 6.0–8.5)
eGFR: 65 mL/min/1.73 (ref >59)

## 2024-09-03 LAB — HGB A1C W/O EAG: Hgb A1c MFr Bld: 6.5 % — ABNORMAL HIGH (ref 4.8–5.6)

## 2024-09-08 ENCOUNTER — Ambulatory Visit: Payer: Self-pay | Admitting: Physician Assistant

## 2024-09-12 ENCOUNTER — Ambulatory Visit (INDEPENDENT_AMBULATORY_CARE_PROVIDER_SITE_OTHER): Admitting: Physician Assistant

## 2024-09-12 ENCOUNTER — Encounter: Payer: Self-pay | Admitting: Physician Assistant

## 2024-09-12 VITALS — BP 124/80 | HR 78 | Temp 98.0°F | Resp 16 | Ht 69.0 in | Wt 210.6 lb

## 2024-09-12 DIAGNOSIS — D72829 Elevated white blood cell count, unspecified: Secondary | ICD-10-CM

## 2024-09-12 DIAGNOSIS — E782 Mixed hyperlipidemia: Secondary | ICD-10-CM | POA: Diagnosis not present

## 2024-09-12 DIAGNOSIS — E1165 Type 2 diabetes mellitus with hyperglycemia: Secondary | ICD-10-CM | POA: Diagnosis not present

## 2024-09-12 DIAGNOSIS — E559 Vitamin D deficiency, unspecified: Secondary | ICD-10-CM

## 2024-09-12 DIAGNOSIS — I10 Essential (primary) hypertension: Secondary | ICD-10-CM

## 2024-09-12 NOTE — Progress Notes (Signed)
 [facility] [address] [place], [place] [address]  Internal MEDICINE  Office Visit Note  Patient Name: Jordan Mccoy  [id number]  [id number]  Date of Service: 09/12/2024  Chief Complaint  Patient presents with   Diabetes   Hypertension   Follow-up    HPI Pt is here for routine follow up -Labs reviewed: vit D low and will restart supplement, WBC high--had some congestion, mucus, but otherwise feels well. No fever or chills. -was able to use coupon for mounjaro  for 3 months for $25. Doing well with this and BG doing well -Bp stable  Current Medication: Outpatient Encounter Medications as of 09/12/2024  Medication Sig   albuterol  (VENTOLIN  HFA) 108 (90 Base) MCG/ACT inhaler TAKE 2 PUFFS BY MOUTH EVERY 6 HOURS AS NEEDED FOR WHEEZE OR SHORTNESS OF BREATH   amLODipine  (NORVASC ) 5 MG tablet TAKE 1 TABLET (5 MG TOTAL) BY MOUTH DAILY.   BD PEN NEEDLE NANO 2ND GEN 32G X 4 MM MISC USE AS DIRECTED WITH VICTOZA    bisoprolol -hydrochlorothiazide  (ZIAC ) 10-6.25 MG tablet TAKE 1 TABLET BY MOUTH EVERY DAY   dapagliflozin  propanediol (FARXIGA ) 10 MG TABS tablet Take 1 tablet (10 mg total) by mouth daily.   fluticasone -salmeterol (WIXELA INHUB) 250-50 MCG/ACT AEPB Inhale 1 puff into the lungs in the morning and at bedtime.   hydrochlorothiazide  (HYDRODIURIL ) 12.5 MG tablet TAKE 1 TABLET BY MOUTH EVERY DAY   Lancets (ONETOUCH ULTRASOFT) lancets Blood sugar testing QAM - E11.65   rosuvastatin  (CRESTOR ) 5 MG tablet TAKE 1 TABLET (5 MG TOTAL) BY MOUTH DAILY.   tirzepatide  (MOUNJARO ) 7.5 MG/0.5ML Pen Inject 7.5 mg into the skin once a week.   No facility-administered encounter medications on file as of 09/12/2024.    Surgical History: Past Surgical History:  Procedure Laterality Date   ABDOMINAL HYSTERECTOMY     CHOLECYSTECTOMY     right foot surgery      Medical History: Past Medical History:  Diagnosis Date   Diabetes mellitus without complication (HCC)    Hypertension      Family History: Family History  Problem Relation Age of Onset   Breast cancer Mother 60   Breast cancer Maternal Aunt 23   Cancer Father     Social History   Socioeconomic History   Marital status: Single    Spouse name: Not on file   Number of children: Not on file   Years of education: Not on file   Highest education level: Not on file  Occupational History   Not on file  Tobacco Use   Smoking status: Never   Smokeless tobacco: Never  Vaping Use   Vaping status: Never Used  Substance and Sexual Activity   Alcohol use: Yes    Comment: social   Drug use: No   Sexual activity: Not on file  Other Topics Concern   Not on file  Social History Narrative   Not on file   Social Drivers of Health   Financial Resource Strain: Not on file  Food Insecurity: Not on file  Transportation Needs: Not on file  Physical Activity: Not on file  Stress: Not on file  Social Connections: Not on file  Intimate Partner Violence: Not on file      Review of Systems  Constitutional:  Negative for chills, fatigue and unexpected weight change.  HENT:  Positive for congestion and postnasal drip. Negative for rhinorrhea, sneezing and sore throat.   Eyes:  Negative for redness.  Respiratory:  Positive for cough. Negative  for chest tightness and shortness of breath.   Cardiovascular:  Negative for chest pain and palpitations.  Gastrointestinal:  Negative for abdominal pain, constipation, diarrhea, nausea and vomiting.  Genitourinary:  Negative for dysuria and frequency.  Musculoskeletal:  Negative for arthralgias, back pain, joint swelling and neck pain.  Skin:  Negative for rash.  Neurological: Negative.  Negative for tremors and numbness.  Hematological:  Negative for adenopathy. Does not bruise/bleed easily.  Psychiatric/Behavioral:  Negative for behavioral problems (Depression), sleep disturbance and suicidal ideas.     Vital Signs: BP 124/80 Comment: 147/80  Pulse 78   Temp  98 F (36.7 C)   Resp 16   Ht 5' 9 (1.753 m)   Wt 210 lb 9.6 oz (95.5 kg)   SpO2 97%   BMI 31.10 kg/m    Physical Exam Vitals and nursing note reviewed.  Constitutional:      General: She is not in acute distress.    Appearance: She is well-developed. She is obese. She is not diaphoretic.  HENT:     Head: Normocephalic and atraumatic.  Eyes:     Extraocular Movements: Extraocular movements intact.  Neck:     Thyroid : No thyromegaly.     Vascular: No JVD.     Trachea: No tracheal deviation.  Cardiovascular:     Rate and Rhythm: Normal rate and regular rhythm.     Heart sounds: Normal heart sounds. No murmur heard.    No friction rub. No gallop.  Pulmonary:     Effort: Pulmonary effort is normal. No respiratory distress.     Breath sounds: No wheezing or rales.  Chest:     Chest wall: No tenderness.  Musculoskeletal:        General: Normal range of motion.  Skin:    General: Skin is warm and dry.  Neurological:     Mental Status: She is alert.  Psychiatric:        Behavior: Behavior normal.        Thought Content: Thought content normal.        Judgment: Judgment normal.        Assessment/Plan: 1. Leukocytosis, unspecified type (Primary) May be due to recent congestion, will recheck cbc for monitoring - CBC w/Diff/Platelet  2. Type 2 diabetes mellitus with hyperglycemia, without long-term current use of insulin  (HCC) A1c controlled at 6.5, up slightly from last check. Will continue mounjaro  as before  3. Essential hypertension Stable, continue current medications  4. Mixed hyperlipidemia Improved, continue crestor  as before  5. Vitamin D  deficiency Restart vitamin D  supplement daily   General Counseling: Penne verbalizes understanding of the findings of todays visit and agrees with plan of treatment. I have discussed any further diagnostic evaluation that may be needed or ordered today. We also reviewed her medications today. she has been encouraged  to call the office with any questions or concerns that should arise related to todays visit.    Orders Placed This Encounter  Procedures   CBC w/Diff/Platelet   Urine Microalbumin w/creat. ratio    No orders of the defined types were placed in this encounter.   This patient was seen by Tinnie Pro, PA-C in collaboration with Dr. Sigrid Bathe as a part of collaborative care agreement.   Total time spent:30 Minutes Time spent includes review of chart, medications, test results, and follow up plan with the patient.      Dr Fozia M Khan Internal medicine

## 2024-09-13 LAB — MICROALBUMIN / CREATININE URINE RATIO
Creatinine, Urine: 220 mg/dL
Microalb/Creat Ratio: 4 mg/g{creat} (ref 0–29)
Microalbumin, Urine: 8.3 ug/mL

## 2024-09-28 ENCOUNTER — Other Ambulatory Visit: Payer: Self-pay | Admitting: Physician Assistant

## 2024-09-28 DIAGNOSIS — E782 Mixed hyperlipidemia: Secondary | ICD-10-CM

## 2024-09-28 DIAGNOSIS — I10 Essential (primary) hypertension: Secondary | ICD-10-CM

## 2024-12-12 ENCOUNTER — Ambulatory Visit: Admitting: Physician Assistant

## 2025-08-17 ENCOUNTER — Encounter: Admitting: Physician Assistant
